# Patient Record
Sex: Male | Born: 1935 | Race: Black or African American | Hispanic: No | Smoking: Former smoker
Health system: Southern US, Community
[De-identification: ages and names within clinical notes are randomized; demographics above are authoritative.]

## PROBLEM LIST (undated history)

## (undated) ENCOUNTER — Inpatient Hospital Stay: Admission: EM | Payer: Self-pay | Source: Home / Self Care

## (undated) DIAGNOSIS — T7840XA Allergy, unspecified, initial encounter: Secondary | ICD-10-CM

## (undated) DIAGNOSIS — I10 Essential (primary) hypertension: Secondary | ICD-10-CM

## (undated) DIAGNOSIS — D126 Benign neoplasm of colon, unspecified: Secondary | ICD-10-CM

## (undated) DIAGNOSIS — N182 Chronic kidney disease, stage 2 (mild): Secondary | ICD-10-CM

## (undated) DIAGNOSIS — E1122 Type 2 diabetes mellitus with diabetic chronic kidney disease: Secondary | ICD-10-CM

## (undated) DIAGNOSIS — B019 Varicella without complication: Secondary | ICD-10-CM

## (undated) DIAGNOSIS — E785 Hyperlipidemia, unspecified: Secondary | ICD-10-CM

## (undated) DIAGNOSIS — E113419 Type 2 diabetes mellitus with severe nonproliferative diabetic retinopathy with macular edema, unspecified eye: Secondary | ICD-10-CM

## (undated) DIAGNOSIS — M1990 Unspecified osteoarthritis, unspecified site: Secondary | ICD-10-CM

## (undated) DIAGNOSIS — C61 Malignant neoplasm of prostate: Secondary | ICD-10-CM

## (undated) DIAGNOSIS — C189 Malignant neoplasm of colon, unspecified: Secondary | ICD-10-CM

## (undated) HISTORY — DX: Essential (primary) hypertension: I10

## (undated) HISTORY — DX: Chronic kidney disease, stage 2 (mild): N18.2

## (undated) HISTORY — DX: Type 2 diabetes mellitus with diabetic chronic kidney disease: E11.22

## (undated) HISTORY — DX: Type 2 diabetes mellitus with severe nonproliferative diabetic retinopathy with macular edema, unspecified eye: E11.3419

## (undated) HISTORY — DX: Benign neoplasm of colon, unspecified: D12.6

## (undated) HISTORY — DX: Malignant neoplasm of colon, unspecified: C18.9

## (undated) HISTORY — DX: Unspecified osteoarthritis, unspecified site: M19.90

## (undated) HISTORY — DX: Varicella without complication: B01.9

## (undated) HISTORY — DX: Allergy, unspecified, initial encounter: T78.40XA

## (undated) HISTORY — DX: Hyperlipidemia, unspecified: E78.5

---

## 2004-01-25 ENCOUNTER — Ambulatory Visit (HOSPITAL_COMMUNITY): Admission: RE | Admit: 2004-01-25 | Discharge: 2004-01-25 | Payer: Self-pay | Admitting: Urology

## 2005-09-02 ENCOUNTER — Emergency Department (HOSPITAL_COMMUNITY): Admission: EM | Admit: 2005-09-02 | Discharge: 2005-09-02 | Payer: Self-pay | Admitting: Family Medicine

## 2006-01-27 ENCOUNTER — Ambulatory Visit (HOSPITAL_COMMUNITY): Admission: RE | Admit: 2006-01-27 | Discharge: 2006-01-27 | Payer: Self-pay | Admitting: Urology

## 2006-08-04 ENCOUNTER — Ambulatory Visit (HOSPITAL_COMMUNITY): Admission: RE | Admit: 2006-08-04 | Discharge: 2006-08-04 | Payer: Self-pay | Admitting: Internal Medicine

## 2007-01-25 ENCOUNTER — Ambulatory Visit (HOSPITAL_COMMUNITY): Admission: RE | Admit: 2007-01-25 | Discharge: 2007-01-25 | Payer: Self-pay | Admitting: Urology

## 2007-07-30 ENCOUNTER — Encounter (HOSPITAL_COMMUNITY): Admission: RE | Admit: 2007-07-30 | Discharge: 2007-10-28 | Payer: Self-pay | Admitting: Urology

## 2009-07-18 ENCOUNTER — Inpatient Hospital Stay (HOSPITAL_COMMUNITY): Admission: EM | Admit: 2009-07-18 | Discharge: 2009-07-21 | Payer: Self-pay | Admitting: Emergency Medicine

## 2009-07-18 ENCOUNTER — Ambulatory Visit: Payer: Self-pay | Admitting: Cardiovascular Disease

## 2009-07-18 ENCOUNTER — Ambulatory Visit: Payer: Self-pay | Admitting: Oncology

## 2009-07-20 ENCOUNTER — Encounter (INDEPENDENT_AMBULATORY_CARE_PROVIDER_SITE_OTHER): Payer: Self-pay | Admitting: Internal Medicine

## 2009-08-08 ENCOUNTER — Ambulatory Visit: Payer: Self-pay | Admitting: Oncology

## 2009-08-14 ENCOUNTER — Inpatient Hospital Stay (HOSPITAL_COMMUNITY): Admission: RE | Admit: 2009-08-14 | Discharge: 2009-08-17 | Payer: Self-pay | Admitting: Surgery

## 2009-08-14 ENCOUNTER — Encounter (INDEPENDENT_AMBULATORY_CARE_PROVIDER_SITE_OTHER): Payer: Self-pay | Admitting: Surgery

## 2009-11-23 ENCOUNTER — Ambulatory Visit: Payer: Self-pay | Admitting: Oncology

## 2009-11-27 LAB — COMPREHENSIVE METABOLIC PANEL
ALT: 14 U/L (ref 0–53)
Alkaline Phosphatase: 46 U/L (ref 39–117)
Calcium: 9.4 mg/dL (ref 8.4–10.5)
Chloride: 104 mEq/L (ref 96–112)
Glucose, Bld: 195 mg/dL — ABNORMAL HIGH (ref 70–99)

## 2009-11-27 LAB — CBC WITH DIFFERENTIAL/PLATELET
Basophils Absolute: 0 10*3/uL (ref 0.0–0.1)
HGB: 11.1 g/dL — ABNORMAL LOW (ref 13.0–17.1)
LYMPH%: 23.7 % (ref 14.0–49.0)
MCV: 95.8 fL (ref 79.3–98.0)
MONO#: 0.4 10*3/uL (ref 0.1–0.9)
MONO%: 6.8 % (ref 0.0–14.0)
NEUT%: 68.3 % (ref 39.0–75.0)
Platelets: 279 10*3/uL (ref 140–400)
RDW: 14.2 % (ref 11.0–14.6)
WBC: 5.9 10*3/uL (ref 4.0–10.3)
lymph#: 1.4 10*3/uL (ref 0.9–3.3)

## 2010-02-22 ENCOUNTER — Ambulatory Visit: Payer: Self-pay | Admitting: Oncology

## 2010-02-26 ENCOUNTER — Ambulatory Visit (HOSPITAL_COMMUNITY): Admission: RE | Admit: 2010-02-26 | Discharge: 2010-02-26 | Payer: Self-pay | Admitting: Oncology

## 2010-03-19 ENCOUNTER — Ambulatory Visit (HOSPITAL_COMMUNITY): Admission: RE | Admit: 2010-03-19 | Discharge: 2010-03-19 | Payer: Self-pay | Admitting: Surgery

## 2010-05-14 ENCOUNTER — Inpatient Hospital Stay (HOSPITAL_COMMUNITY)
Admission: RE | Admit: 2010-05-14 | Discharge: 2010-05-21 | Payer: Self-pay | Source: Home / Self Care | Attending: Surgery | Admitting: Surgery

## 2010-05-14 ENCOUNTER — Encounter (INDEPENDENT_AMBULATORY_CARE_PROVIDER_SITE_OTHER): Payer: Self-pay | Admitting: Surgery

## 2010-06-07 ENCOUNTER — Other Ambulatory Visit: Payer: Self-pay | Admitting: Oncology

## 2010-06-07 DIAGNOSIS — Z85038 Personal history of other malignant neoplasm of large intestine: Secondary | ICD-10-CM

## 2010-07-29 LAB — GLUCOSE, CAPILLARY
Glucose-Capillary: 122 mg/dL — ABNORMAL HIGH (ref 70–99)
Glucose-Capillary: 124 mg/dL — ABNORMAL HIGH (ref 70–99)
Glucose-Capillary: 129 mg/dL — ABNORMAL HIGH (ref 70–99)
Glucose-Capillary: 148 mg/dL — ABNORMAL HIGH (ref 70–99)
Glucose-Capillary: 153 mg/dL — ABNORMAL HIGH (ref 70–99)
Glucose-Capillary: 154 mg/dL — ABNORMAL HIGH (ref 70–99)
Glucose-Capillary: 172 mg/dL — ABNORMAL HIGH (ref 70–99)
Glucose-Capillary: 178 mg/dL — ABNORMAL HIGH (ref 70–99)
Glucose-Capillary: 178 mg/dL — ABNORMAL HIGH (ref 70–99)
Glucose-Capillary: 180 mg/dL — ABNORMAL HIGH (ref 70–99)
Glucose-Capillary: 185 mg/dL — ABNORMAL HIGH (ref 70–99)
Glucose-Capillary: 186 mg/dL — ABNORMAL HIGH (ref 70–99)
Glucose-Capillary: 219 mg/dL — ABNORMAL HIGH (ref 70–99)
Glucose-Capillary: 263 mg/dL — ABNORMAL HIGH (ref 70–99)
Glucose-Capillary: 47 mg/dL — ABNORMAL LOW (ref 70–99)
Glucose-Capillary: 53 mg/dL — ABNORMAL LOW (ref 70–99)
Glucose-Capillary: 55 mg/dL — ABNORMAL LOW (ref 70–99)
Glucose-Capillary: 55 mg/dL — ABNORMAL LOW (ref 70–99)
Glucose-Capillary: 69 mg/dL — ABNORMAL LOW (ref 70–99)
Glucose-Capillary: 70 mg/dL (ref 70–99)
Glucose-Capillary: 70 mg/dL (ref 70–99)
Glucose-Capillary: 72 mg/dL (ref 70–99)
Glucose-Capillary: 88 mg/dL (ref 70–99)
Glucose-Capillary: 97 mg/dL (ref 70–99)

## 2010-07-29 LAB — CBC
HCT: 25.6 % — ABNORMAL LOW (ref 39.0–52.0)
HCT: 32.5 % — ABNORMAL LOW (ref 39.0–52.0)
Hemoglobin: 9.3 g/dL — ABNORMAL LOW (ref 13.0–17.0)
MCH: 30.7 pg (ref 26.0–34.0)
MCH: 31.1 pg (ref 26.0–34.0)
MCHC: 33.1 g/dL (ref 30.0–36.0)
MCHC: 33.5 g/dL (ref 30.0–36.0)
MCV: 93.3 fL (ref 78.0–100.0)
Platelets: 264 10*3/uL (ref 150–400)
Platelets: 272 10*3/uL (ref 150–400)
RBC: 2.75 MIL/uL — ABNORMAL LOW (ref 4.22–5.81)
RBC: 2.96 MIL/uL — ABNORMAL LOW (ref 4.22–5.81)
RBC: 3.46 MIL/uL — ABNORMAL LOW (ref 4.22–5.81)
RDW: 12.7 % (ref 11.5–15.5)
RDW: 12.7 % (ref 11.5–15.5)
WBC: 14.5 10*3/uL — ABNORMAL HIGH (ref 4.0–10.5)
WBC: 19.3 10*3/uL — ABNORMAL HIGH (ref 4.0–10.5)
WBC: 7 10*3/uL (ref 4.0–10.5)

## 2010-07-29 LAB — COMPREHENSIVE METABOLIC PANEL
AST: 15 U/L (ref 0–37)
Albumin: 3.8 g/dL (ref 3.5–5.2)
BUN: 21 mg/dL (ref 6–23)
Chloride: 108 mEq/L (ref 96–112)
GFR calc Af Amer: 51 mL/min — ABNORMAL LOW (ref >60)
Sodium: 137 mEq/L (ref 135–145)
Total Protein: 7.4 g/dL (ref 6.0–8.3)

## 2010-07-29 LAB — BASIC METABOLIC PANEL
BUN: 11 mg/dL (ref 6–23)
BUN: 14 mg/dL (ref 6–23)
CO2: 26 mEq/L (ref 19–32)
CO2: 28 mEq/L (ref 19–32)
Calcium: 8.5 mg/dL (ref 8.4–10.5)
Chloride: 100 mEq/L (ref 96–112)
Creatinine, Ser: 1.42 mg/dL (ref 0.4–1.5)
GFR calc Af Amer: 59 mL/min — ABNORMAL LOW (ref >60)
GFR calc Af Amer: >60 mL/min (ref >60)
GFR calc non Af Amer: 49 mL/min — ABNORMAL LOW (ref >60)
GFR calc non Af Amer: 54 mL/min — ABNORMAL LOW (ref >60)
Potassium: 3.7 mEq/L (ref 3.5–5.1)
Sodium: 135 mEq/L (ref 135–145)
Sodium: 136 mEq/L (ref 135–145)

## 2010-07-29 LAB — HEMOGLOBIN A1C: Mean Plasma Glucose: 131 mg/dL — ABNORMAL HIGH (ref <117)

## 2010-08-07 LAB — CBC
MCHC: 31.8 g/dL (ref 30.0–36.0)
Platelets: 252 10*3/uL (ref 150–400)
RDW: 29.1 % — ABNORMAL HIGH (ref 11.5–15.5)

## 2010-08-07 LAB — CREATININE, SERUM
Creatinine, Ser: 1.33 mg/dL (ref 0.4–1.5)
GFR calc Af Amer: >60 mL/min (ref >60)
GFR calc non Af Amer: 52 mL/min — ABNORMAL LOW (ref >60)

## 2010-08-12 LAB — RETICULOCYTES
RBC.: 3.06 MIL/uL — ABNORMAL LOW (ref 4.22–5.81)
Retic Ct Pct: 1.2 % (ref 0.4–3.1)
Retic Ct Pct: 1.4 % (ref 0.4–3.1)

## 2010-08-12 LAB — CBC
HCT: 21.2 % — ABNORMAL LOW (ref 39.0–52.0)
HCT: 26.6 % — ABNORMAL LOW (ref 39.0–52.0)
HCT: 28.6 % — ABNORMAL LOW (ref 39.0–52.0)
HCT: 30.3 % — ABNORMAL LOW (ref 39.0–52.0)
Hemoglobin: 12 g/dL — ABNORMAL LOW (ref 13.0–17.0)
Hemoglobin: 9.4 g/dL — ABNORMAL LOW (ref 13.0–17.0)
MCHC: 30.4 g/dL (ref 30.0–36.0)
MCHC: 31.9 g/dL (ref 30.0–36.0)
MCHC: 32.1 g/dL (ref 30.0–36.0)
MCHC: 32.1 g/dL (ref 30.0–36.0)
MCHC: 32.2 g/dL (ref 30.0–36.0)
MCV: 68.3 fL — ABNORMAL LOW (ref 78.0–100.0)
MCV: 76.3 fL — ABNORMAL LOW (ref 78.0–100.0)
MCV: 78.2 fL (ref 78.0–100.0)
MCV: 81.5 fL (ref 78.0–100.0)
MCV: 83.2 fL (ref 78.0–100.0)
MCV: 83.6 fL (ref 78.0–100.0)
Platelets: 281 10*3/uL (ref 150–400)
Platelets: 281 10*3/uL (ref 150–400)
Platelets: 369 10*3/uL (ref 150–400)
Platelets: 374 10*3/uL (ref 150–400)
Platelets: 474 10*3/uL — ABNORMAL HIGH (ref 150–400)
RBC: 3.11 MIL/uL — ABNORMAL LOW (ref 4.22–5.81)
RBC: 3.48 MIL/uL — ABNORMAL LOW (ref 4.22–5.81)
RBC: 3.88 MIL/uL — ABNORMAL LOW (ref 4.22–5.81)
RBC: 4.58 MIL/uL (ref 4.22–5.81)
RDW: 24 % — ABNORMAL HIGH (ref 11.5–15.5)
RDW: 28.9 % — ABNORMAL HIGH (ref 11.5–15.5)
WBC: 13.5 10*3/uL — ABNORMAL HIGH (ref 4.0–10.5)
WBC: 18.7 10*3/uL — ABNORMAL HIGH (ref 4.0–10.5)
WBC: 8.1 10*3/uL (ref 4.0–10.5)
WBC: 8.4 10*3/uL (ref 4.0–10.5)
WBC: 9 10*3/uL (ref 4.0–10.5)

## 2010-08-12 LAB — CROSSMATCH

## 2010-08-12 LAB — BASIC METABOLIC PANEL
BUN: 14 mg/dL (ref 6–23)
BUN: 6 mg/dL (ref 6–23)
CO2: 23 mEq/L (ref 19–32)
CO2: 24 mEq/L (ref 19–32)
Chloride: 102 mEq/L (ref 96–112)
Chloride: 107 mEq/L (ref 96–112)
Chloride: 110 mEq/L (ref 96–112)
Creatinine, Ser: 1.56 mg/dL — ABNORMAL HIGH (ref 0.4–1.5)
GFR calc Af Amer: 52 mL/min — ABNORMAL LOW (ref >60)
GFR calc non Af Amer: 54 mL/min — ABNORMAL LOW (ref >60)
Glucose, Bld: 194 mg/dL — ABNORMAL HIGH (ref 70–99)
Potassium: 3.7 mEq/L (ref 3.5–5.1)
Potassium: 3.9 mEq/L (ref 3.5–5.1)
Sodium: 134 mEq/L — ABNORMAL LOW (ref 135–145)
Sodium: 139 mEq/L (ref 135–145)

## 2010-08-12 LAB — GLUCOSE, CAPILLARY
Glucose-Capillary: 133 mg/dL — ABNORMAL HIGH (ref 70–99)
Glucose-Capillary: 174 mg/dL — ABNORMAL HIGH (ref 70–99)
Glucose-Capillary: 181 mg/dL — ABNORMAL HIGH (ref 70–99)
Glucose-Capillary: 67 mg/dL — ABNORMAL LOW (ref 70–99)
Glucose-Capillary: 83 mg/dL (ref 70–99)
Glucose-Capillary: 97 mg/dL (ref 70–99)

## 2010-08-12 LAB — TYPE AND SCREEN: ABO/RH(D): O POS

## 2010-08-12 LAB — DIFFERENTIAL
Basophils Absolute: 0 10*3/uL (ref 0.0–0.1)
Eosinophils Absolute: 0 10*3/uL (ref 0.0–0.7)
Monocytes Absolute: 0.8 10*3/uL (ref 0.1–1.0)
Neutrophils Relative %: 80 % — ABNORMAL HIGH (ref 43–77)

## 2010-08-12 LAB — COMPREHENSIVE METABOLIC PANEL
BUN: 13 mg/dL (ref 6–23)
CO2: 25 mEq/L (ref 19–32)
Calcium: 8.3 mg/dL — ABNORMAL LOW (ref 8.4–10.5)
Chloride: 105 mEq/L (ref 96–112)
Creatinine, Ser: 1.78 mg/dL — ABNORMAL HIGH (ref 0.4–1.5)
GFR calc non Af Amer: 37 mL/min — ABNORMAL LOW (ref >60)
Total Bilirubin: 0.6 mg/dL (ref 0.3–1.2)

## 2010-08-12 LAB — URINALYSIS, ROUTINE W REFLEX MICROSCOPIC
Bilirubin Urine: NEGATIVE
Hgb urine dipstick: NEGATIVE
Specific Gravity, Urine: 1.023 (ref 1.005–1.030)
pH: 5.5 (ref 5.0–8.0)

## 2010-08-12 LAB — PROTIME-INR
INR: 0.97 (ref 0.00–1.49)
Prothrombin Time: 12.8 seconds (ref 11.6–15.2)
Prothrombin Time: 13.2 seconds (ref 11.6–15.2)

## 2010-08-12 LAB — IGG, IGA, IGM
IgA: 209 mg/dL (ref 68–378)
IgM, Serum: 109 mg/dL (ref 60–263)

## 2010-08-12 LAB — ABO/RH: ABO/RH(D): O POS

## 2010-08-12 LAB — FERRITIN: Ferritin: 8 ng/mL — ABNORMAL LOW (ref 22–322)

## 2010-08-12 LAB — PROTEIN ELECTROPH W RFLX QUANT IMMUNOGLOBULINS
Beta Globulin: 7.6 % — ABNORMAL HIGH (ref 4.7–7.2)
Gamma Globulin: 18.9 % — ABNORMAL HIGH (ref 11.1–18.8)
M-Spike, %: 0.6 g/dL
Total Protein ELP: 7.1 g/dL (ref 6.0–8.3)

## 2010-08-12 LAB — URINE MICROSCOPIC-ADD ON

## 2010-08-12 LAB — CREATININE, SERUM
Creatinine, Ser: 1.71 mg/dL — ABNORMAL HIGH (ref 0.4–1.5)
GFR calc Af Amer: 47 mL/min — ABNORMAL LOW (ref >60)
GFR calc Af Amer: 55 mL/min — ABNORMAL LOW (ref >60)

## 2010-08-12 LAB — IRON AND TIBC

## 2010-08-12 LAB — POTASSIUM: Potassium: 4.5 mEq/L (ref 3.5–5.1)

## 2010-08-12 LAB — FOLATE: Folate: 11.4 ng/mL

## 2010-08-12 LAB — POCT CARDIAC MARKERS

## 2010-08-27 ENCOUNTER — Other Ambulatory Visit: Payer: Self-pay | Admitting: Oncology

## 2010-08-27 ENCOUNTER — Ambulatory Visit (HOSPITAL_COMMUNITY)
Admission: RE | Admit: 2010-08-27 | Discharge: 2010-08-27 | Disposition: A | Discharge status: Home / Self Care | Payer: Medicare Other | Source: Ambulatory Visit | Attending: Oncology | Admitting: Oncology

## 2010-08-27 ENCOUNTER — Encounter (HOSPITAL_BASED_OUTPATIENT_CLINIC_OR_DEPARTMENT_OTHER): Payer: Medicare Other | Admitting: Oncology

## 2010-08-27 ENCOUNTER — Encounter (HOSPITAL_COMMUNITY): Payer: Self-pay

## 2010-08-27 DIAGNOSIS — Z85038 Personal history of other malignant neoplasm of large intestine: Secondary | ICD-10-CM | POA: Insufficient documentation

## 2010-08-27 DIAGNOSIS — Z8546 Personal history of malignant neoplasm of prostate: Secondary | ICD-10-CM | POA: Insufficient documentation

## 2010-08-27 DIAGNOSIS — C184 Malignant neoplasm of transverse colon: Secondary | ICD-10-CM

## 2010-08-27 DIAGNOSIS — Z09 Encounter for follow-up examination after completed treatment for conditions other than malignant neoplasm: Secondary | ICD-10-CM | POA: Insufficient documentation

## 2010-08-27 DIAGNOSIS — I700 Atherosclerosis of aorta: Secondary | ICD-10-CM | POA: Insufficient documentation

## 2010-08-27 DIAGNOSIS — K5730 Diverticulosis of large intestine without perforation or abscess without bleeding: Secondary | ICD-10-CM | POA: Insufficient documentation

## 2010-08-27 DIAGNOSIS — Q618 Other cystic kidney diseases: Secondary | ICD-10-CM | POA: Insufficient documentation

## 2010-08-27 HISTORY — DX: Malignant neoplasm of prostate: C61

## 2010-08-27 HISTORY — DX: Essential (primary) hypertension: I10

## 2010-08-27 HISTORY — DX: Malignant neoplasm of colon, unspecified: C18.9

## 2010-08-27 LAB — CMP (CANCER CENTER ONLY)
ALT(SGPT): 13 U/L (ref 10–47)
AST: 19 U/L (ref 11–38)
Albumin: 3.6 g/dL (ref 3.3–5.5)
Alkaline Phosphatase: 56 U/L (ref 26–84)
BUN, Bld: 16 mg/dL (ref 7–22)
CO2: 24 mEq/L (ref 18–33)
Calcium: 9.2 mg/dL (ref 8.0–10.3)
Chloride: 103 mEq/L (ref 98–108)
Creat: 1.5 mg/dl — ABNORMAL HIGH (ref 0.6–1.2)
Glucose, Bld: 129 mg/dL — ABNORMAL HIGH (ref 73–118)
Potassium: 3.8 mEq/L (ref 3.3–4.7)
Sodium: 139 mEq/L (ref 128–145)
Total Bilirubin: 0.7 mg/dl (ref 0.20–1.60)
Total Protein: 7.8 g/dL (ref 6.4–8.1)

## 2010-08-27 LAB — CBC WITH DIFFERENTIAL/PLATELET
BASO%: 0.5 % (ref 0.0–2.0)
Basophils Absolute: 0 10*3/uL (ref 0.0–0.1)
EOS%: 0.5 % (ref 0.0–7.0)
Eosinophils Absolute: 0 10*3/uL (ref 0.0–0.5)
HCT: 32 % — ABNORMAL LOW (ref 38.4–49.9)
HGB: 10.8 g/dL — ABNORMAL LOW (ref 13.0–17.1)
LYMPH%: 23 % (ref 14.0–49.0)
MCH: 31.6 pg (ref 27.2–33.4)
MCHC: 33.9 g/dL (ref 32.0–36.0)
MCV: 93.2 fL (ref 79.3–98.0)
MONO#: 0.5 10*3/uL (ref 0.1–0.9)
MONO%: 6.4 % (ref 0.0–14.0)
NEUT#: 5.2 10*3/uL (ref 1.5–6.5)
NEUT%: 69.6 % (ref 39.0–75.0)
Platelets: 291 10*3/uL (ref 140–400)
RBC: 3.44 10*6/uL — ABNORMAL LOW (ref 4.20–5.82)
RDW: 14.7 % — ABNORMAL HIGH (ref 11.0–14.6)
WBC: 7.5 10*3/uL (ref 4.0–10.3)
lymph#: 1.7 10*3/uL (ref 0.9–3.3)

## 2010-08-27 LAB — LACTATE DEHYDROGENASE: LDH: 106 U/L (ref 94–250)

## 2010-08-27 MED ORDER — IOHEXOL 300 MG/ML  SOLN
80.0000 mL | Freq: Once | INTRAMUSCULAR | Status: AC | PRN
  Administered 2010-08-27: 80 mL via INTRAVENOUS

## 2010-09-03 ENCOUNTER — Other Ambulatory Visit: Payer: Self-pay | Admitting: Oncology

## 2010-09-03 ENCOUNTER — Encounter (HOSPITAL_BASED_OUTPATIENT_CLINIC_OR_DEPARTMENT_OTHER): Payer: Medicare Other | Admitting: Oncology

## 2010-09-03 DIAGNOSIS — Z85038 Personal history of other malignant neoplasm of large intestine: Secondary | ICD-10-CM

## 2010-09-03 DIAGNOSIS — D649 Anemia, unspecified: Secondary | ICD-10-CM

## 2010-09-03 DIAGNOSIS — C184 Malignant neoplasm of transverse colon: Secondary | ICD-10-CM

## 2010-09-03 DIAGNOSIS — E119 Type 2 diabetes mellitus without complications: Secondary | ICD-10-CM

## 2011-03-04 ENCOUNTER — Encounter (HOSPITAL_BASED_OUTPATIENT_CLINIC_OR_DEPARTMENT_OTHER): Payer: Medicare Other | Admitting: Oncology

## 2011-03-04 ENCOUNTER — Other Ambulatory Visit: Payer: Self-pay | Admitting: Oncology

## 2011-03-04 ENCOUNTER — Ambulatory Visit (HOSPITAL_COMMUNITY)
Admission: RE | Admit: 2011-03-04 | Discharge: 2011-03-04 | Disposition: A | Discharge status: Home / Self Care | Payer: Medicare Other | Source: Ambulatory Visit | Attending: Oncology | Admitting: Oncology

## 2011-03-04 DIAGNOSIS — Z923 Personal history of irradiation: Secondary | ICD-10-CM | POA: Insufficient documentation

## 2011-03-04 DIAGNOSIS — C189 Malignant neoplasm of colon, unspecified: Secondary | ICD-10-CM | POA: Insufficient documentation

## 2011-03-04 DIAGNOSIS — D175 Benign lipomatous neoplasm of intra-abdominal organs: Secondary | ICD-10-CM | POA: Insufficient documentation

## 2011-03-04 DIAGNOSIS — Z85038 Personal history of other malignant neoplasm of large intestine: Secondary | ICD-10-CM

## 2011-03-04 DIAGNOSIS — C184 Malignant neoplasm of transverse colon: Secondary | ICD-10-CM

## 2011-03-04 DIAGNOSIS — Z8546 Personal history of malignant neoplasm of prostate: Secondary | ICD-10-CM | POA: Insufficient documentation

## 2011-03-04 DIAGNOSIS — Z980 Intestinal bypass and anastomosis status: Secondary | ICD-10-CM | POA: Insufficient documentation

## 2011-03-04 LAB — CBC WITH DIFFERENTIAL/PLATELET
Basophils Absolute: 0 10*3/uL (ref 0.0–0.1)
Eosinophils Absolute: 0.1 10*3/uL (ref 0.0–0.5)
HGB: 11.5 g/dL — ABNORMAL LOW (ref 13.0–17.1)
LYMPH%: 21.5 % (ref 14.0–49.0)
MONO#: 0.5 10*3/uL (ref 0.1–0.9)
NEUT#: 4.6 10*3/uL (ref 1.5–6.5)
Platelets: 283 10*3/uL (ref 140–400)
RBC: 3.59 10*6/uL — ABNORMAL LOW (ref 4.20–5.82)
WBC: 6.6 10*3/uL (ref 4.0–10.3)

## 2011-03-04 LAB — CEA: CEA: 0.7 ng/mL (ref 0.0–5.0)

## 2011-03-04 LAB — LACTATE DEHYDROGENASE: LDH: 114 U/L (ref 94–250)

## 2011-03-04 LAB — CMP (CANCER CENTER ONLY)
Albumin: 3.5 g/dL (ref 3.3–5.5)
BUN, Bld: 16 mg/dL (ref 7–22)
CO2: 27 mEq/L (ref 18–33)
Glucose, Bld: 129 mg/dL — ABNORMAL HIGH (ref 73–118)
Potassium: 4.6 mEq/L (ref 3.3–4.7)
Sodium: 141 mEq/L (ref 128–145)
Total Bilirubin: 0.7 mg/dl (ref 0.20–1.60)
Total Protein: 7.7 g/dL (ref 6.4–8.1)

## 2011-03-11 ENCOUNTER — Encounter (HOSPITAL_BASED_OUTPATIENT_CLINIC_OR_DEPARTMENT_OTHER): Payer: Medicare Other | Admitting: Oncology

## 2011-03-11 DIAGNOSIS — E119 Type 2 diabetes mellitus without complications: Secondary | ICD-10-CM

## 2011-03-11 DIAGNOSIS — C184 Malignant neoplasm of transverse colon: Secondary | ICD-10-CM

## 2011-03-11 DIAGNOSIS — I10 Essential (primary) hypertension: Secondary | ICD-10-CM

## 2011-08-17 IMAGING — CT CT ABD-PELV W/ CM
2 of 5 series · 16 of 46 positions shown, 18 images · IV contrast (APPLIED)
Comparison: None.

CLINICAL DATA: Abnormal colonoscopy - evaluate for metastatic
disease.  History of prostate cancer and anemia.

CT ABDOMEN AND PELVIS WITH CONTRAST
TECHNIQUE: Multidetector CT imaging of the abdomen and pelvis was
performed following the standard protocol during bolus
administration of intravenous contrast.
Contrast: 125 ml Gmnipaque-XKK intravenously.  Oral contrast was
given.

[Series 2: abd_pel 5.0 b40f st · axial · 0.76mm/px · z∈[-483,-83]mm · 13 of 90 slices shown, 15 images]
[im 5/90  soft-tissue]
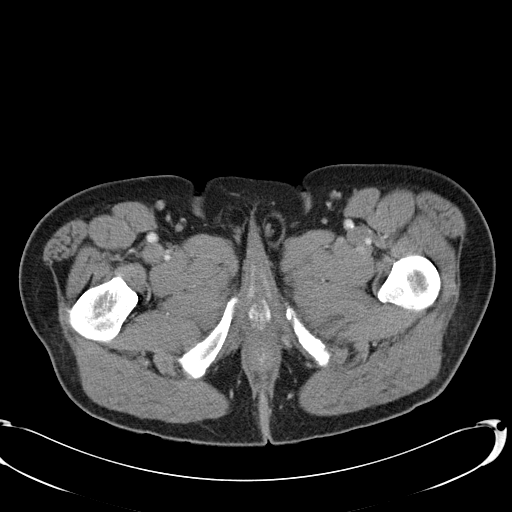
[im 5/90  bone]
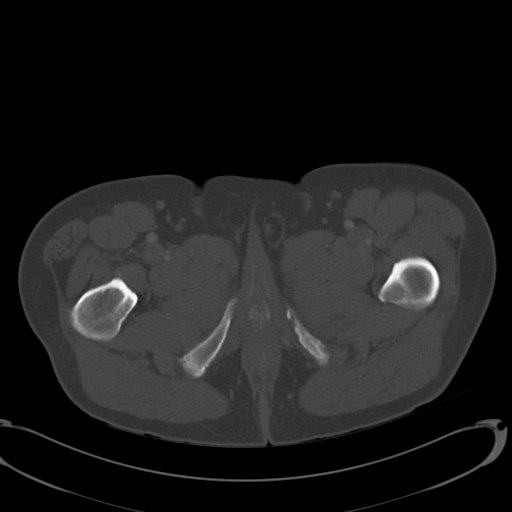
[im 15/90  soft-tissue]
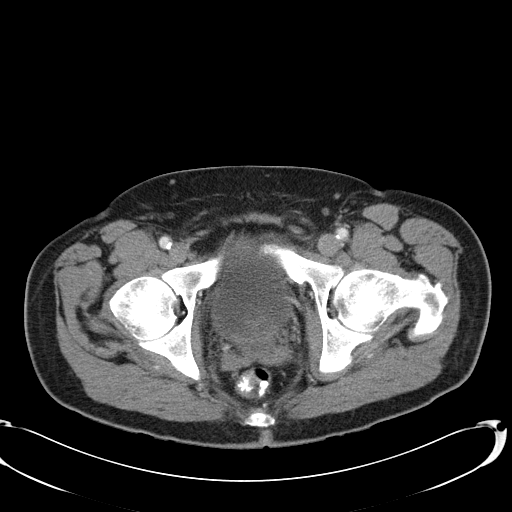
[im 19/90  soft-tissue]
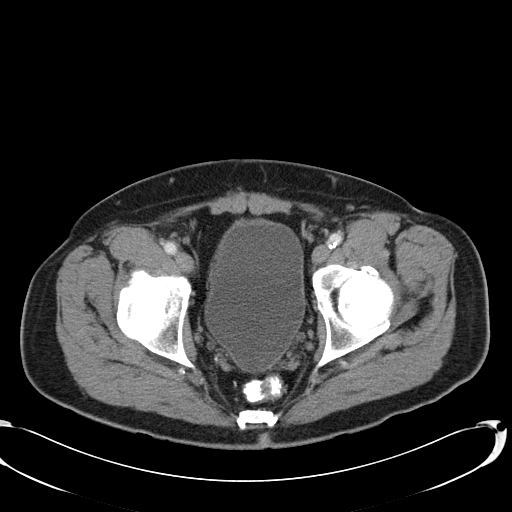
[im 24/90  soft-tissue]
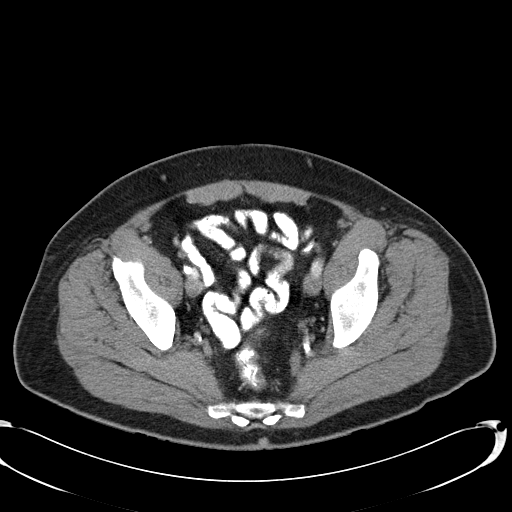
[im 33/90  soft-tissue]
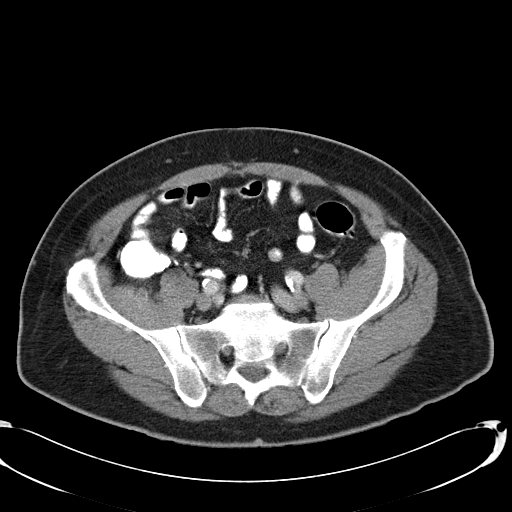
[im 38/90  soft-tissue]
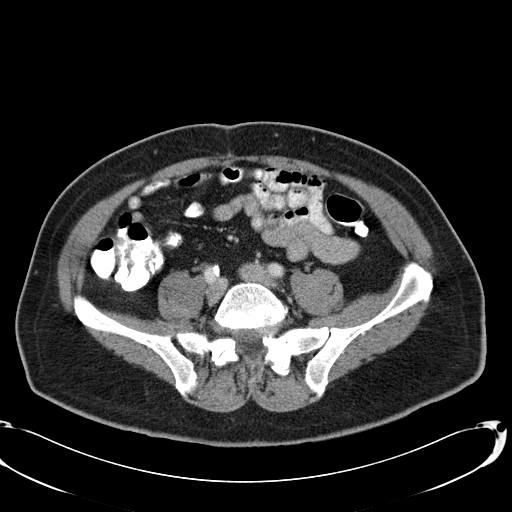
[im 47/90  soft-tissue]
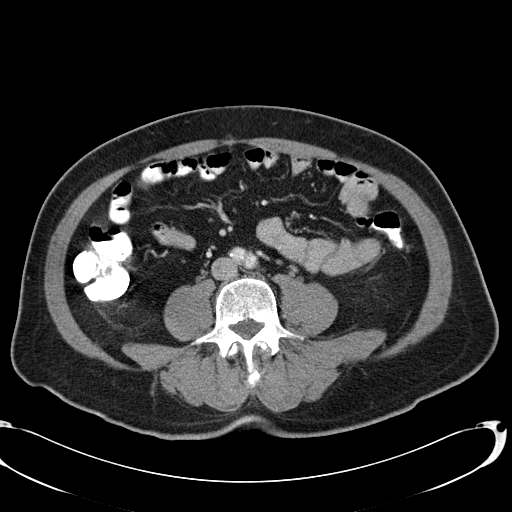
[im 52/90  soft-tissue]
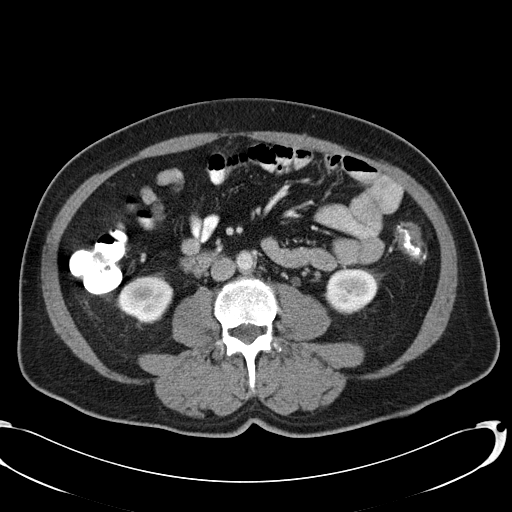
[im 57/90  soft-tissue]
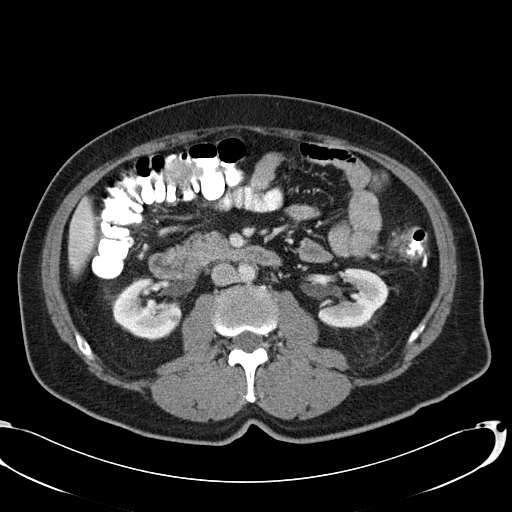
[im 57/90  bone]
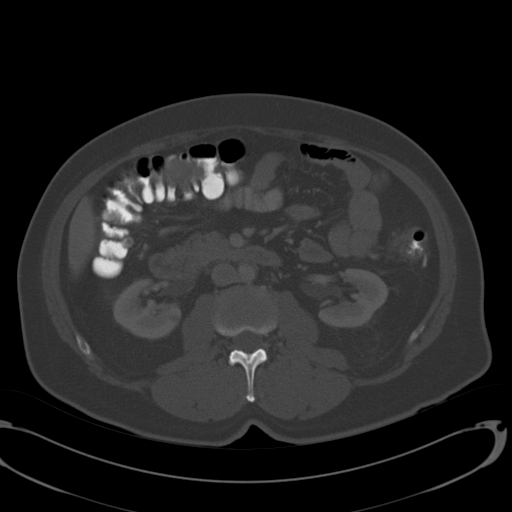
[im 66/90  soft-tissue]
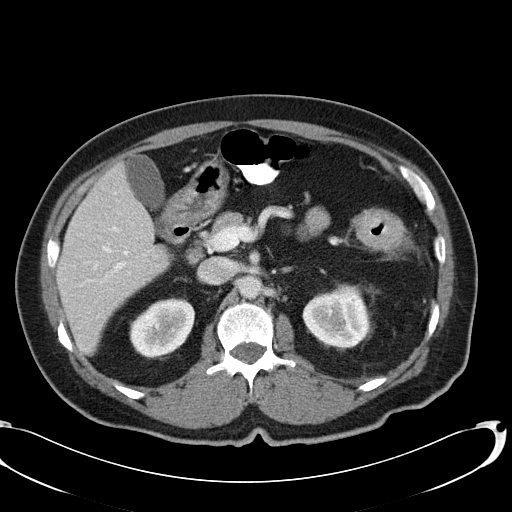
[im 71/90  soft-tissue]
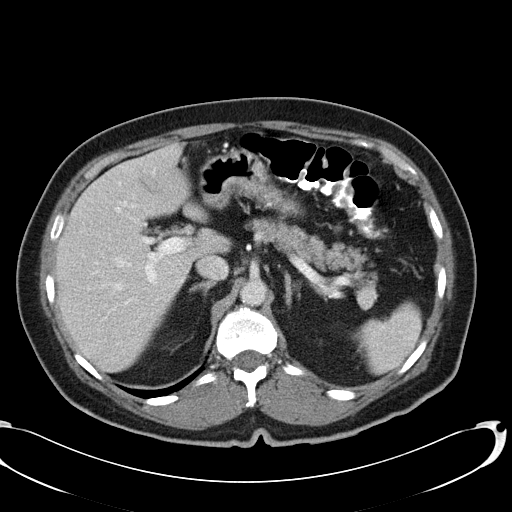
[im 75/90  soft-tissue]
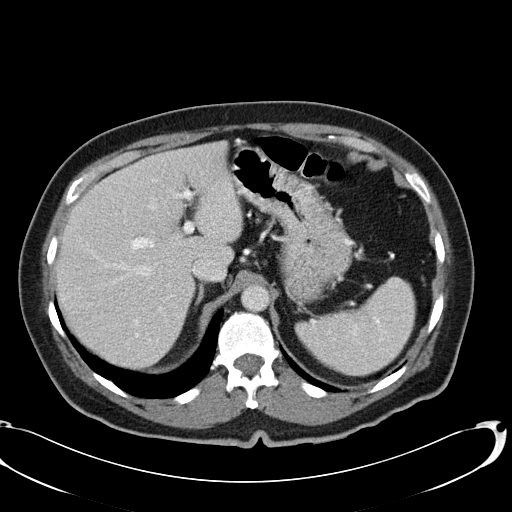
[im 85/90  soft-tissue]
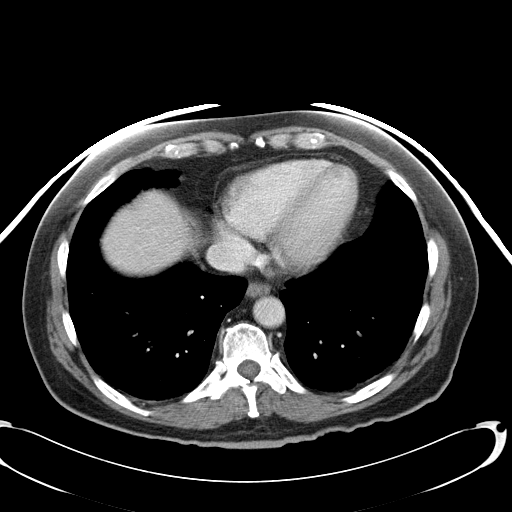

[Series 602: coronal abdomen · coronal · 0.91mm/px · 3 of 129 slices shown]
[im 43/129  soft-tissue]
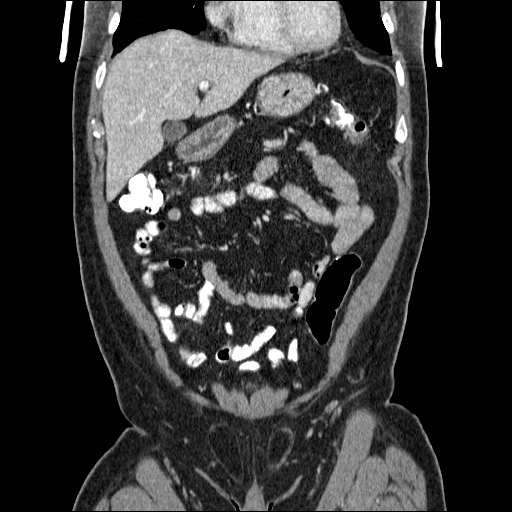
[im 57/129  soft-tissue]
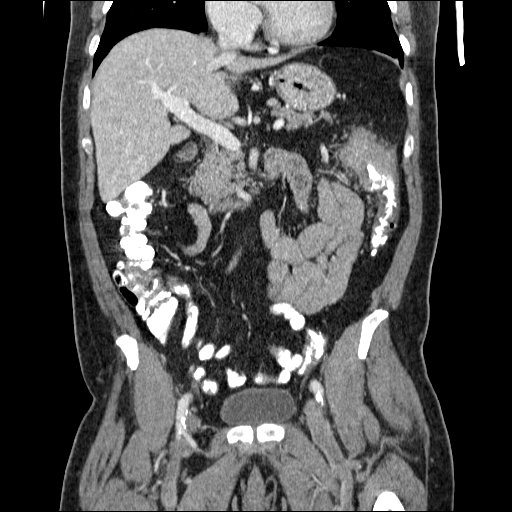
[im 72/129  soft-tissue]
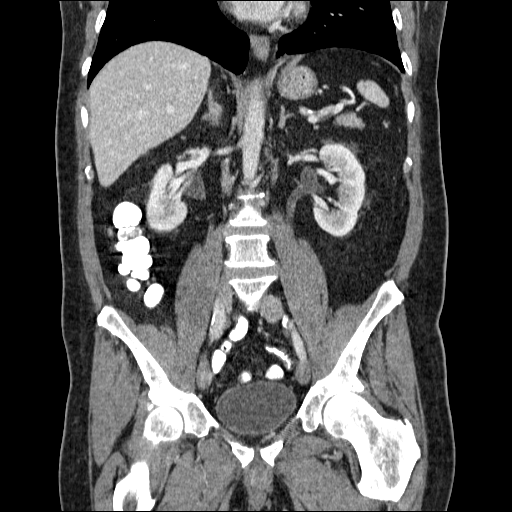

[16 of 46 positions shown; findings below may reference images not displayed]

FINDINGS: There is a small amount residual stool in the colon.  The
right colon is incompletely distended.  There is an apparent
irregular intraluminal mass proximally in the transverse colon,
measuring 4.5 x 3.0 cm on image 32.  More distally, there is marked
circumferential thickening of the walls of the colon at the splenic
flexure extending over approximately 8 cm.  There is mild stranding
in the adjacent pericolonic fat, but no focal extraluminal fluid or
air collection is identified.  There are mild diverticula of the
distal colon.  There is no evidence of bowel obstruction.

The lung bases are clear.  There is no pleural effusion.  The
liver, spleen, gallbladder, pancreas, adrenal glands and kidneys
appear normal aside from a probable sub centimeter cyst posteriorly
in the right kidney.

There is a 12 mm nonspecific portacaval node on image 24.  There
are no enlarged mesenteric lymph nodes.  There is no ascites or
peritoneal nodularity.  There are no suspicious osseous findings.
IMPRESSION: 1.  Apparent large intraluminal mass in the proximal transverse
colon, suspicious for colon cancer or a large villous adenoma.
Correlation with prior colonoscopy is recommended.
2.  Moderate segment area of marked circumferential bowel wall
thickening near the splenic flexure of the colon suspicious for
infiltrating neoplasm.  Focal colitis/diverticulitis is considered
less likely.  Correlation with prior colonoscopy is recommended.
3.  No definite evidence of metastatic disease.

## 2011-09-02 ENCOUNTER — Other Ambulatory Visit (HOSPITAL_BASED_OUTPATIENT_CLINIC_OR_DEPARTMENT_OTHER): Payer: Medicare Other | Admitting: Lab

## 2011-09-02 DIAGNOSIS — D649 Anemia, unspecified: Secondary | ICD-10-CM

## 2011-09-02 DIAGNOSIS — C184 Malignant neoplasm of transverse colon: Secondary | ICD-10-CM

## 2011-09-02 DIAGNOSIS — E119 Type 2 diabetes mellitus without complications: Secondary | ICD-10-CM

## 2011-09-02 LAB — CBC WITH DIFFERENTIAL/PLATELET
BASO%: 0.6 % (ref 0.0–2.0)
EOS%: 0.9 % (ref 0.0–7.0)
MCH: 31.7 pg (ref 27.2–33.4)
MCHC: 33.1 g/dL (ref 32.0–36.0)
RDW: 14.3 % (ref 11.0–14.6)
lymph#: 1.6 10*3/uL (ref 0.9–3.3)
nRBC: 0 % (ref 0–0)

## 2011-09-04 LAB — COMPREHENSIVE METABOLIC PANEL
AST: 13 U/L (ref 0–37)
Albumin: 3.8 g/dL (ref 3.5–5.2)
Alkaline Phosphatase: 55 U/L (ref 39–117)
Potassium: 3.8 mEq/L (ref 3.5–5.3)
Sodium: 137 mEq/L (ref 135–145)
Total Protein: 6.7 g/dL (ref 6.0–8.3)

## 2011-09-04 LAB — IMMUNOFIXATION ELECTROPHORESIS
IgM, Serum: 123 mg/dL (ref 41–251)
Total Protein, Serum Electrophoresis: 6.7 g/dL (ref 6.0–8.3)

## 2011-09-09 ENCOUNTER — Ambulatory Visit (HOSPITAL_BASED_OUTPATIENT_CLINIC_OR_DEPARTMENT_OTHER): Payer: Medicare Other | Admitting: Nurse Practitioner

## 2011-09-09 ENCOUNTER — Telehealth: Payer: Self-pay | Admitting: Oncology

## 2011-09-09 VITALS — BP 121/64 | HR 67 | Temp 97.5°F | Wt 180.5 lb

## 2011-09-09 DIAGNOSIS — D126 Benign neoplasm of colon, unspecified: Secondary | ICD-10-CM

## 2011-09-09 DIAGNOSIS — E119 Type 2 diabetes mellitus without complications: Secondary | ICD-10-CM

## 2011-09-09 DIAGNOSIS — C184 Malignant neoplasm of transverse colon: Secondary | ICD-10-CM

## 2011-09-09 DIAGNOSIS — Z85038 Personal history of other malignant neoplasm of large intestine: Secondary | ICD-10-CM

## 2011-09-09 DIAGNOSIS — I10 Essential (primary) hypertension: Secondary | ICD-10-CM

## 2011-09-09 NOTE — Telephone Encounter (Signed)
appts made and printed for pt,pt stated that he has contrast at home   aom

## 2011-09-09 NOTE — Progress Notes (Signed)
OFFICE PROGRESS NOTE  Interval history:  Mr. Winthrop is a 78 year old man who initially presented with anemia in March 2011. He was found to have a large lesion in the transverse colon. He underwent initial surgical resection on 08/14/2009. He was found to have a 6.5 cm, grade 2, adenocarcinoma with 25 negative lymph nodes. Tubular adenomas and a sessile tubulovillous adenoma were also contained within the surgical specimen. No vascular or lymphatic invasion identified. Followup CT scan 02/26/2010 showed a focal area of soft tissue density in the area of the hepatic flexure. Repeat colonoscopy on 04/01/2010 showed a number of small sessile polyps in the sigmoid, descending and proximal transverse colon removed by hot snare. In addition there was one large polyp at the hepatic flexure that can only be partially resected. On 05/14/2010 he underwent a right hemicolectomy. Pathology showed 2 tubular adenomas, 3 cm and 1 cm, with high-grade dysplasia but no invasion. 14 additional lymph nodes taken at the time of that surgery were negative.  Mr. Osborn reports that he feels well. No change in bowel habits. No abdominal pain. He denies nausea/vomiting. He has a good appetite and good energy level.   Objective: Blood pressure 121/64, pulse 67, temperature 97.5 F (36.4 C), temperature source Oral, weight 180 lb 8 oz (81.874 kg).  Oropharynx is without thrush or ulceration. No palpable cervical, supraclavicular, axillary or inguinal lymph nodes. Lungs are clear. Regular cardiac rhythm. Abdomen is soft and nontender. No mass. Organomegaly. Extremities are without edema.  Lab Results: Lab Results  Component Value Date   WBC 7.5 09/02/2011   HGB 12.0* 09/02/2011   HCT 36.2* 09/02/2011   MCV 95.8 09/02/2011   PLT 268 09/02/2011    Chemistry:    Chemistry      Component Value Date/Time   NA 137 09/02/2011 0858   NA 141 03/04/2011 0858   K 3.8 09/02/2011 0858   K 4.6 03/04/2011 0858   CL 103 09/02/2011 0858     CL 98 03/04/2011 0858   CO2 28 09/02/2011 0858   CO2 27 03/04/2011 0858   BUN 13 09/02/2011 0858   BUN 16 03/04/2011 0858   CREATININE 1.51* 09/02/2011 0858   CREATININE 1.8* 03/04/2011 0858      Component Value Date/Time   CALCIUM 9.2 09/02/2011 0858   CALCIUM 9.2 03/04/2011 0858   ALKPHOS 55 09/02/2011 0858   ALKPHOS 68 03/04/2011 0858   AST 13 09/02/2011 0858   AST 22 03/04/2011 0858   ALT 9 09/02/2011 0858   BILITOT 0.5 09/02/2011 0858   BILITOT 0.70 03/04/2011 0858     09/02/2011 CEA 0.7   Studies/Results: No results found.  Medications: I have reviewed the patient's current medications.  Assessment/Plan:  1. 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  2. Tubular adenomas resected from the right colon with additional multiple sessile polyps throughout the colon status post right hemicolectomy 05/14/2010.  3. Type 2 diabetes.  4. Essential hypertension.  5. Legal blindness.    Disposition-Mr. Dekoning appears stable. We are referring him for restaging CT scans in the next one week. Per Dr. Patsy Lager note dated 03/11/2011, if things look good on the upcoming CT scan, no further CT scans are planned, with additional studies based on clinical symptoms or physical findings. Mr. Starner will return for a followup visit in 6 months. He will contact the office in the interim with any problems.  Lonna Cobb ANP/GNP-BC

## 2011-09-16 ENCOUNTER — Ambulatory Visit (HOSPITAL_COMMUNITY)
Admission: RE | Admit: 2011-09-16 | Discharge: 2011-09-16 | Disposition: A | Discharge status: Home / Self Care | Payer: Medicare Other | Source: Ambulatory Visit | Attending: Nurse Practitioner | Admitting: Nurse Practitioner

## 2011-09-16 DIAGNOSIS — R911 Solitary pulmonary nodule: Secondary | ICD-10-CM | POA: Insufficient documentation

## 2011-09-16 DIAGNOSIS — Z85038 Personal history of other malignant neoplasm of large intestine: Secondary | ICD-10-CM | POA: Insufficient documentation

## 2011-09-16 DIAGNOSIS — K4020 Bilateral inguinal hernia, without obstruction or gangrene, not specified as recurrent: Secondary | ICD-10-CM | POA: Insufficient documentation

## 2011-10-10 ENCOUNTER — Telehealth: Payer: Self-pay | Admitting: *Deleted

## 2011-10-10 NOTE — Telephone Encounter (Signed)
Notified pt of CT results per Dr. Cyndie Chime.

## 2011-10-10 NOTE — Telephone Encounter (Signed)
Message copied by Sabino Snipes on Fri Oct 10, 2011  3:19 PM ------      Message from: Levert Feinstein      Created: Fri Oct 10, 2011 10:54 AM       Call pt CT negative for ca

## 2011-10-22 ENCOUNTER — Telehealth: Payer: Self-pay | Admitting: *Deleted

## 2011-10-22 NOTE — Telephone Encounter (Signed)
Received call from Barb/Dr Magod's office asking if pt need a repeat colonoscopy.  She reports that Dr Cyndie Chime was going to discuss with the pt at his May visit.  She can be reached at [phone] or [phone].  Note to Dr. Cyndie Chime.

## 2011-10-23 ENCOUNTER — Telehealth: Payer: Self-pay | Admitting: *Deleted

## 2011-10-23 NOTE — Telephone Encounter (Signed)
Barb/Dr.Magod's office called back today & message given per Dr. Cyndie Chime that a repeat colonoscopy is a judgement call & typically repeat colonoscopy done 1 year after surgery; he had a large lesion but it turned out to be non-invasive.  Informed that Dr. Cyndie Chime would defer to Dr. Marlane Hatcher judgement.

## 2012-01-07 ENCOUNTER — Other Ambulatory Visit: Payer: Self-pay | Admitting: Gastroenterology

## 2012-03-05 ENCOUNTER — Telehealth: Payer: Self-pay | Admitting: *Deleted

## 2012-03-05 NOTE — Telephone Encounter (Signed)
Received call from pt asking office to return call.  Returned call and spoke with pt; asked if he was suppose to have a CT scan next week with MD appt.  Informed pt that this RN did not see any  CT scan scheduled.  Pt verbalized understanding stated "I just wanted to be sure"  Confirmed appt 03/09/12 at 10:45 lab then MD at 11:15.

## 2012-03-09 ENCOUNTER — Ambulatory Visit (HOSPITAL_BASED_OUTPATIENT_CLINIC_OR_DEPARTMENT_OTHER): Payer: Medicare Other | Admitting: Oncology

## 2012-03-09 ENCOUNTER — Encounter: Payer: Self-pay | Admitting: Oncology

## 2012-03-09 ENCOUNTER — Other Ambulatory Visit (HOSPITAL_BASED_OUTPATIENT_CLINIC_OR_DEPARTMENT_OTHER): Payer: Medicare Other

## 2012-03-09 ENCOUNTER — Telehealth: Payer: Self-pay | Admitting: Oncology

## 2012-03-09 VITALS — BP 113/64 | HR 73 | Temp 97.2°F | Resp 20 | Wt 174.3 lb

## 2012-03-09 DIAGNOSIS — C184 Malignant neoplasm of transverse colon: Secondary | ICD-10-CM

## 2012-03-09 DIAGNOSIS — E119 Type 2 diabetes mellitus without complications: Secondary | ICD-10-CM

## 2012-03-09 DIAGNOSIS — Z85038 Personal history of other malignant neoplasm of large intestine: Secondary | ICD-10-CM

## 2012-03-09 DIAGNOSIS — H548 Legal blindness, as defined in USA: Secondary | ICD-10-CM

## 2012-03-09 DIAGNOSIS — C189 Malignant neoplasm of colon, unspecified: Secondary | ICD-10-CM

## 2012-03-09 DIAGNOSIS — E1122 Type 2 diabetes mellitus with diabetic chronic kidney disease: Secondary | ICD-10-CM

## 2012-03-09 DIAGNOSIS — I10 Essential (primary) hypertension: Secondary | ICD-10-CM

## 2012-03-09 DIAGNOSIS — D126 Benign neoplasm of colon, unspecified: Secondary | ICD-10-CM | POA: Insufficient documentation

## 2012-03-09 DIAGNOSIS — E113419 Type 2 diabetes mellitus with severe nonproliferative diabetic retinopathy with macular edema, unspecified eye: Secondary | ICD-10-CM | POA: Insufficient documentation

## 2012-03-09 HISTORY — DX: Benign neoplasm of colon, unspecified: D12.6

## 2012-03-09 HISTORY — DX: Type 2 diabetes mellitus with diabetic chronic kidney disease: E11.22

## 2012-03-09 HISTORY — DX: Malignant neoplasm of colon, unspecified: C18.9

## 2012-03-09 HISTORY — DX: Type 2 diabetes mellitus with severe nonproliferative diabetic retinopathy with macular edema, unspecified eye: E11.3419

## 2012-03-09 HISTORY — DX: Essential (primary) hypertension: I10

## 2012-03-09 LAB — CBC WITH DIFFERENTIAL/PLATELET
BASO%: 0.6 % (ref 0.0–2.0)
Basophils Absolute: 0 10*3/uL (ref 0.0–0.1)
EOS%: 2.7 % (ref 0.0–7.0)
HCT: 38.7 % (ref 38.4–49.9)
HGB: 13.2 g/dL (ref 13.0–17.1)
LYMPH%: 19.2 % (ref 14.0–49.0)
MCH: 32.4 pg (ref 27.2–33.4)
MCHC: 34.1 g/dL (ref 32.0–36.0)
NEUT%: 69.7 % (ref 39.0–75.0)
Platelets: 277 10*3/uL (ref 140–400)

## 2012-03-09 LAB — COMPREHENSIVE METABOLIC PANEL (CC13)
AST: 15 U/L (ref 5–34)
Albumin: 3.8 g/dL (ref 3.5–5.0)
Alkaline Phosphatase: 70 U/L (ref 40–150)
BUN: 25 mg/dL (ref 7.0–26.0)
Calcium: 9.3 mg/dL (ref 8.4–10.4)
Chloride: 102 mEq/L (ref 98–107)
Creatinine: 1.7 mg/dL — ABNORMAL HIGH (ref 0.7–1.3)
Glucose: 141 mg/dl — ABNORMAL HIGH (ref 70–99)

## 2012-03-09 NOTE — Telephone Encounter (Signed)
gv and printed pt appt schedule for OCT and APril.

## 2012-03-09 NOTE — Progress Notes (Signed)
Hematology and Oncology Follow Up Visit  Nathan Goodwin [id number] 1930-11-06 78 y.o. 03/09/2012 10:53 AM   Principle Diagnosis: Encounter Diagnosis  Name Primary?  . Colon cancer Yes     Interim History:   Followup visit for this pleasant 78 year old man who initially presented with anemia without guaiac-positive stools back in March 2011.  He was found to have a large lesion in the transverse colon.  He underwent initial surgical resection by Dr. Estelle Grumbles on 08/14/2009.  He was found to have a 6.5-cm grade II adenocarcinoma, 25 benign lymph nodes.  Tubular adenomas and a sessile tubulovillous adenoma also contained within the surgical specimen.  No vascular or lymphatic invasion.  Pathologic stage T3 N0 M0.   On a routine followup CT scan done 02/26/2010, there was a focal area of soft tissue density in the area of the hepatic flexure.  A repeat colonoscopy was done by Dr. Vida Rigger on 04/01/2010.  A number of small sessile polyps found in the sigmoid, descending, and proximal transverse colon removed by hot snare but, in addition, 1 large polyp at the hepatic flexure that could only be partially resected.   The patient was taken to surgery again on 05/14/2010 and a right hemicolectomy was performed.  Pathology showed 2 tubular adenomas, 3 cm and 1 cm with high-grade dysplasia but no invasion.  Fourteen additional lymph nodes taken at time of that surgery were negative.  The patient has been followed with observation alone.   He had a followup colonoscopy on 01/07/2012 by Dr. Ewing Schlein. There were no residual polyps.  Clinically he is doing well. He reports no abdominal pain or change in bowel habits. No hematochezia or melena.   Medications: reviewed  Allergies: No Known Allergies  Review of Systems: Constitutional:   No constitutional symptoms Respiratory: No cough or dyspnea Cardiovascular:  No chest pain or palpitations Gastrointestinal: See above Genito-Urinary: Not  questioned Musculoskeletal: No muscle or bone pain Neurologic: Chronic, poor vision. Skin: No rash Remaining ROS negative.  Physical Exam: Blood pressure 113/64, pulse 73, temperature 97.2 F (36.2 C), temperature source Oral, resp. rate 20, weight 174 lb 4.8 oz (79.062 kg). Wt Readings from Last 3 Encounters:  03/09/12 174 lb 4.8 oz (79.062 kg)  09/09/11 180 lb 8 oz (81.874 kg)     General appearance: Pleasant, elderly, African American man HENNT: Pharynx no erythema or exudate Lymph nodes: No adenopathy Breasts: Lungs: Clear to auscultation resonant to percussion Heart: Regular rhythm no murmur Abdomen: Soft, nontender, no mass, no organomegaly Extremities: No edema, no calf tenderness Vascular: No cyanosis Neurologic: Decreased vision both eyes Skin: No rash or ecchymosis  Lab Results: Lab Results  Component Value Date   WBC 7.7 03/09/2012   HGB 13.2 03/09/2012   HCT 38.7 03/09/2012   MCV 95.1 03/09/2012   PLT 277 03/09/2012     Chemistry      Component Value Date/Time   NA 137 09/02/2011 0858   NA 141 03/04/2011 0858   K 3.8 09/02/2011 0858   K 4.6 03/04/2011 0858   CL 103 09/02/2011 0858   CL 98 03/04/2011 0858   CO2 28 09/02/2011 0858   CO2 27 03/04/2011 0858   BUN 13 09/02/2011 0858   BUN 16 03/04/2011 0858   CREATININE 1.51* 09/02/2011 0858   CREATININE 1.8* 03/04/2011 0858      Component Value Date/Time   CALCIUM 9.2 09/02/2011 0858   CALCIUM 9.2 03/04/2011 0858   ALKPHOS 55 09/02/2011 0858  ALKPHOS 68 03/04/2011 0858   AST 13 09/02/2011 0858   AST 22 03/04/2011 0858   ALT 9 09/02/2011 0858   BILITOT 0.5 09/02/2011 0858   BILITOT 0.70 03/04/2011 0858       Impression and Plan:   1. Stage II, T3 N0 M0 adenocarcinoma of transverse colon, treated with surgery alone.  2. Tubular adenomas resected from the right colon with additional multiple sessile polyps throughout the colon.  3. Type 2 diabetes.  4. Essential hypertension.  5. Legal blindness.     I will get a CT scan of the abdomen to assess his liver which is a 6 month interval from his last scan. He will be reevaluated again in 6 months which will be the approximate 3 year anniversary from his original diagnosis. I will get an additional scan at that time. If this is negative then I think we can just follow him clinically and with labs over time.  CC:. Dr. Margaretmary Bayley; Dr. Viviann Spare gross; Dr. Vida Rigger   Levert Feinstein, MD 10/22/201310:53 AM

## 2012-03-15 ENCOUNTER — Ambulatory Visit (HOSPITAL_COMMUNITY)
Admission: RE | Admit: 2012-03-15 | Discharge: 2012-03-15 | Disposition: A | Discharge status: Home / Self Care | Payer: Medicare Other | Source: Ambulatory Visit | Attending: Oncology | Admitting: Oncology

## 2012-03-15 DIAGNOSIS — C189 Malignant neoplasm of colon, unspecified: Secondary | ICD-10-CM | POA: Insufficient documentation

## 2012-03-18 ENCOUNTER — Telehealth: Payer: Self-pay | Admitting: *Deleted

## 2012-03-18 NOTE — Telephone Encounter (Signed)
Message copied by Sabino Snipes on Thu Mar 18, 2012  4:20 PM ------      Message from: Levert Feinstein      Created: Mon Mar 15, 2012  9:42 AM       Call pt CT abdomen normal

## 2012-03-18 NOTE — Telephone Encounter (Signed)
Pt notified that CT results normal per Dr. Cyndie Chime.

## 2012-03-24 ENCOUNTER — Other Ambulatory Visit (HOSPITAL_COMMUNITY): Payer: Self-pay | Admitting: Urology

## 2012-03-24 DIAGNOSIS — C61 Malignant neoplasm of prostate: Secondary | ICD-10-CM

## 2012-04-09 ENCOUNTER — Encounter (HOSPITAL_COMMUNITY)
Admission: RE | Admit: 2012-04-09 | Discharge: 2012-04-09 | Disposition: A | Discharge status: Home / Self Care | Payer: Medicare Other | Source: Ambulatory Visit | Attending: Urology | Admitting: Urology

## 2012-04-09 DIAGNOSIS — C61 Malignant neoplasm of prostate: Secondary | ICD-10-CM | POA: Insufficient documentation

## 2012-04-09 DIAGNOSIS — M19019 Primary osteoarthritis, unspecified shoulder: Secondary | ICD-10-CM | POA: Insufficient documentation

## 2012-04-09 DIAGNOSIS — M1710 Unilateral primary osteoarthritis, unspecified knee: Secondary | ICD-10-CM | POA: Insufficient documentation

## 2012-04-09 MED ORDER — TECHNETIUM TC 99M MEDRONATE IV KIT
25.0000 | PACK | Freq: Once | INTRAVENOUS | Status: AC | PRN
  Administered 2012-04-09: 25 via INTRAVENOUS

## 2012-09-07 ENCOUNTER — Other Ambulatory Visit: Payer: Medicare Other | Admitting: Lab

## 2012-09-07 ENCOUNTER — Ambulatory Visit: Payer: Medicare Other | Admitting: Nurse Practitioner

## 2013-09-30 ENCOUNTER — Telehealth: Payer: Self-pay | Admitting: Family Medicine

## 2013-09-30 NOTE — Telephone Encounter (Signed)
Entered in error

## 2014-07-06 ENCOUNTER — Other Ambulatory Visit (HOSPITAL_COMMUNITY): Payer: Self-pay | Admitting: Urology

## 2014-07-06 DIAGNOSIS — C61 Malignant neoplasm of prostate: Secondary | ICD-10-CM

## 2014-07-14 ENCOUNTER — Encounter (HOSPITAL_COMMUNITY): Payer: Self-pay

## 2014-07-14 ENCOUNTER — Encounter (HOSPITAL_COMMUNITY)
Admission: RE | Admit: 2014-07-14 | Discharge: 2014-07-14 | Disposition: A | Discharge status: Home / Self Care | Payer: Medicare Other | Source: Ambulatory Visit | Attending: Urology | Admitting: Urology

## 2014-07-14 DIAGNOSIS — C61 Malignant neoplasm of prostate: Secondary | ICD-10-CM | POA: Insufficient documentation

## 2014-07-14 MED ORDER — TECHNETIUM TC 99M MEDRONATE IV KIT
25.0000 | PACK | Freq: Once | INTRAVENOUS | Status: AC | PRN
  Administered 2014-07-14: 25.7 via INTRAVENOUS

## 2016-06-07 DIAGNOSIS — Z23 Encounter for immunization: Secondary | ICD-10-CM | POA: Insufficient documentation

## 2016-06-07 DIAGNOSIS — Z7984 Long term (current) use of oral hypoglycemic drugs: Secondary | ICD-10-CM | POA: Insufficient documentation

## 2016-06-07 DIAGNOSIS — J019 Acute sinusitis, unspecified: Secondary | ICD-10-CM | POA: Insufficient documentation

## 2016-06-07 DIAGNOSIS — D649 Anemia, unspecified: Secondary | ICD-10-CM | POA: Insufficient documentation

## 2016-06-07 DIAGNOSIS — R109 Unspecified abdominal pain: Secondary | ICD-10-CM | POA: Insufficient documentation

## 2016-06-07 DIAGNOSIS — E113219 Type 2 diabetes mellitus with mild nonproliferative diabetic retinopathy with macular edema, unspecified eye: Secondary | ICD-10-CM | POA: Diagnosis not present

## 2016-06-07 DIAGNOSIS — R55 Syncope and collapse: Secondary | ICD-10-CM | POA: Diagnosis not present

## 2016-06-07 DIAGNOSIS — Z79899 Other long term (current) drug therapy: Secondary | ICD-10-CM | POA: Insufficient documentation

## 2016-06-07 DIAGNOSIS — I129 Hypertensive chronic kidney disease with stage 1 through stage 4 chronic kidney disease, or unspecified chronic kidney disease: Secondary | ICD-10-CM | POA: Insufficient documentation

## 2016-06-07 DIAGNOSIS — E1122 Type 2 diabetes mellitus with diabetic chronic kidney disease: Secondary | ICD-10-CM | POA: Diagnosis not present

## 2016-06-07 DIAGNOSIS — Z9049 Acquired absence of other specified parts of digestive tract: Secondary | ICD-10-CM | POA: Insufficient documentation

## 2016-06-07 DIAGNOSIS — E785 Hyperlipidemia, unspecified: Secondary | ICD-10-CM | POA: Insufficient documentation

## 2016-06-07 DIAGNOSIS — E860 Dehydration: Secondary | ICD-10-CM | POA: Diagnosis not present

## 2016-06-07 DIAGNOSIS — N182 Chronic kidney disease, stage 2 (mild): Secondary | ICD-10-CM | POA: Insufficient documentation

## 2016-06-08 ENCOUNTER — Emergency Department (HOSPITAL_COMMUNITY): Payer: Medicare Other

## 2016-06-08 ENCOUNTER — Observation Stay (HOSPITAL_COMMUNITY)
Admission: EM | Admit: 2016-06-08 | Discharge: 2016-06-09 | Disposition: A | Discharge status: Home / Self Care | Payer: Medicare Other | Attending: Internal Medicine | Admitting: Internal Medicine

## 2016-06-08 ENCOUNTER — Encounter (HOSPITAL_COMMUNITY): Payer: Self-pay | Admitting: Nurse Practitioner

## 2016-06-08 DIAGNOSIS — I10 Essential (primary) hypertension: Secondary | ICD-10-CM | POA: Diagnosis not present

## 2016-06-08 DIAGNOSIS — E118 Type 2 diabetes mellitus with unspecified complications: Secondary | ICD-10-CM

## 2016-06-08 DIAGNOSIS — E785 Hyperlipidemia, unspecified: Secondary | ICD-10-CM | POA: Diagnosis not present

## 2016-06-08 DIAGNOSIS — R55 Syncope and collapse: Secondary | ICD-10-CM | POA: Diagnosis not present

## 2016-06-08 DIAGNOSIS — E1122 Type 2 diabetes mellitus with diabetic chronic kidney disease: Secondary | ICD-10-CM

## 2016-06-08 DIAGNOSIS — E113419 Type 2 diabetes mellitus with severe nonproliferative diabetic retinopathy with macular edema, unspecified eye: Secondary | ICD-10-CM | POA: Diagnosis present

## 2016-06-08 DIAGNOSIS — D649 Anemia, unspecified: Secondary | ICD-10-CM

## 2016-06-08 DIAGNOSIS — N182 Chronic kidney disease, stage 2 (mild): Secondary | ICD-10-CM

## 2016-06-08 DIAGNOSIS — R109 Unspecified abdominal pain: Secondary | ICD-10-CM

## 2016-06-08 DIAGNOSIS — N289 Disorder of kidney and ureter, unspecified: Secondary | ICD-10-CM

## 2016-06-08 LAB — URINALYSIS, ROUTINE W REFLEX MICROSCOPIC
Bilirubin Urine: NEGATIVE
Hgb urine dipstick: NEGATIVE
KETONES UR: 20 mg/dL — AB
Leukocytes, UA: NEGATIVE
NITRITE: NEGATIVE
PH: 7 (ref 5.0–8.0)
Protein, ur: 30 mg/dL — AB
Specific Gravity, Urine: 1.021 (ref 1.005–1.030)

## 2016-06-08 LAB — CBC WITH DIFFERENTIAL/PLATELET
BASOS PCT: 0 %
Basophils Absolute: 0 10*3/uL (ref 0.0–0.1)
Eosinophils Absolute: 0.1 10*3/uL (ref 0.0–0.7)
Eosinophils Relative: 1 %
HCT: 32.7 % — ABNORMAL LOW (ref 39.0–52.0)
HEMOGLOBIN: 11 g/dL — AB (ref 13.0–17.0)
LYMPHS PCT: 16 %
Lymphs Abs: 1.5 10*3/uL (ref 0.7–4.0)
MCH: 31.2 pg (ref 26.0–34.0)
MCHC: 33.6 g/dL (ref 30.0–36.0)
MCV: 92.6 fL (ref 78.0–100.0)
MONOS PCT: 9 %
Monocytes Absolute: 0.9 10*3/uL (ref 0.1–1.0)
NEUTROS ABS: 7.3 10*3/uL (ref 1.7–7.7)
NEUTROS PCT: 74 %
Platelets: 261 10*3/uL (ref 150–400)
RBC: 3.53 MIL/uL — ABNORMAL LOW (ref 4.22–5.81)
RDW: 12.9 % (ref 11.5–15.5)
WBC: 9.8 10*3/uL (ref 4.0–10.5)

## 2016-06-08 LAB — COMPREHENSIVE METABOLIC PANEL
ALT: 9 U/L — AB (ref 17–63)
AST: 13 U/L — AB (ref 15–41)
Albumin: 4.2 g/dL (ref 3.5–5.0)
Alkaline Phosphatase: 49 U/L (ref 38–126)
Anion gap: 9 (ref 5–15)
BILIRUBIN TOTAL: 0.8 mg/dL (ref 0.3–1.2)
BUN: 19 mg/dL (ref 6–20)
CALCIUM: 8.8 mg/dL — AB (ref 8.9–10.3)
CO2: 26 mmol/L (ref 22–32)
CREATININE: 1.74 mg/dL — AB (ref 0.61–1.24)
Chloride: 99 mmol/L — ABNORMAL LOW (ref 101–111)
GFR, EST AFRICAN AMERICAN: 40 mL/min — AB (ref >60)
GFR, EST NON AFRICAN AMERICAN: 35 mL/min — AB (ref >60)
Glucose, Bld: 216 mg/dL — ABNORMAL HIGH (ref 65–99)
Potassium: 4.2 mmol/L (ref 3.5–5.1)
Sodium: 134 mmol/L — ABNORMAL LOW (ref 135–145)
Total Protein: 8 g/dL (ref 6.5–8.1)

## 2016-06-08 LAB — GLUCOSE, CAPILLARY
GLUCOSE-CAPILLARY: 158 mg/dL — AB (ref 65–99)
GLUCOSE-CAPILLARY: 178 mg/dL — AB (ref 65–99)
Glucose-Capillary: 164 mg/dL — ABNORMAL HIGH (ref 65–99)

## 2016-06-08 LAB — CBG MONITORING, ED: Glucose-Capillary: 225 mg/dL — ABNORMAL HIGH (ref 65–99)

## 2016-06-08 LAB — TROPONIN I

## 2016-06-08 LAB — LIPASE, BLOOD: LIPASE: 27 U/L (ref 11–51)

## 2016-06-08 MED ORDER — ATORVASTATIN CALCIUM 20 MG PO TABS
20.0000 mg | ORAL_TABLET | Freq: Every evening | ORAL | Status: DC
  Administered 2016-06-08: 20 mg via ORAL
  Filled 2016-06-08: qty 1

## 2016-06-08 MED ORDER — ONDANSETRON HCL 4 MG PO TABS: 4.0000 mg | ORAL_TABLET | Freq: Four times a day (QID) | ORAL | Status: DC | PRN

## 2016-06-08 MED ORDER — ACETAMINOPHEN 650 MG RE SUPP: 650.0000 mg | Freq: Four times a day (QID) | RECTAL | Status: DC | PRN

## 2016-06-08 MED ORDER — SODIUM CHLORIDE 0.9 % IV SOLN
INTRAVENOUS | Status: DC
  Administered 2016-06-08 (×2): via INTRAVENOUS

## 2016-06-08 MED ORDER — CALCIUM CARBONATE-VITAMIN D 500-200 MG-UNIT PO TABS
1.0000 | ORAL_TABLET | Freq: Every day | ORAL | Status: DC
  Administered 2016-06-08 – 2016-06-09 (×2): 1 via ORAL
  Filled 2016-06-08 (×2): qty 1

## 2016-06-08 MED ORDER — OXYMETAZOLINE HCL 0.05 % NA SOLN
1.0000 | Freq: Two times a day (BID) | NASAL | Status: DC
  Administered 2016-06-08 – 2016-06-09 (×3): 1 via NASAL
  Filled 2016-06-08: qty 15

## 2016-06-08 MED ORDER — GUAIFENESIN ER 600 MG PO TB12
600.0000 mg | ORAL_TABLET | Freq: Two times a day (BID) | ORAL | Status: DC
  Administered 2016-06-08 – 2016-06-09 (×3): 600 mg via ORAL
  Filled 2016-06-08 (×3): qty 1

## 2016-06-08 MED ORDER — TAMSULOSIN HCL 0.4 MG PO CAPS
0.4000 mg | ORAL_CAPSULE | Freq: Every day | ORAL | Status: DC
  Administered 2016-06-08 – 2016-06-09 (×2): 0.4 mg via ORAL
  Filled 2016-06-08 (×2): qty 1

## 2016-06-08 MED ORDER — MEGESTROL ACETATE 20 MG PO TABS
20.0000 mg | ORAL_TABLET | Freq: Every day | ORAL | Status: DC
  Administered 2016-06-08: 20 mg via ORAL
  Filled 2016-06-08: qty 1

## 2016-06-08 MED ORDER — EZETIMIBE-SIMVASTATIN 10-40 MG PO TABS
1.0000 | ORAL_TABLET | Freq: Every day | ORAL | Status: DC
  Administered 2016-06-08: 1 via ORAL
  Filled 2016-06-08: qty 1

## 2016-06-08 MED ORDER — GLUCERNA SHAKE PO LIQD
237.0000 mL | Freq: Two times a day (BID) | ORAL | Status: DC
  Administered 2016-06-08 – 2016-06-09 (×2): 237 mL via ORAL
  Filled 2016-06-08 (×3): qty 237

## 2016-06-08 MED ORDER — INSULIN ASPART 100 UNIT/ML ~~LOC~~ SOLN
0.0000 [IU] | Freq: Three times a day (TID) | SUBCUTANEOUS | Status: DC
  Administered 2016-06-08 (×2): 2 [IU] via SUBCUTANEOUS
  Administered 2016-06-09: 3 [IU] via SUBCUTANEOUS
  Administered 2016-06-09: 2 [IU] via SUBCUTANEOUS

## 2016-06-08 MED ORDER — PROPRANOLOL HCL 10 MG PO TABS: 20.0000 mg | ORAL_TABLET | Freq: Four times a day (QID) | ORAL | Status: DC | PRN

## 2016-06-08 MED ORDER — ALUM & MAG HYDROXIDE-SIMETH 200-200-20 MG/5ML PO SUSP: 10.0000 mL | Freq: Four times a day (QID) | ORAL | Status: DC | PRN

## 2016-06-08 MED ORDER — ENOXAPARIN SODIUM 40 MG/0.4ML ~~LOC~~ SOLN
40.0000 mg | SUBCUTANEOUS | Status: DC
  Administered 2016-06-08 – 2016-06-09 (×2): 40 mg via SUBCUTANEOUS
  Filled 2016-06-08 (×2): qty 0.4

## 2016-06-08 MED ORDER — ACETAMINOPHEN 325 MG PO TABS: 650.0000 mg | ORAL_TABLET | Freq: Four times a day (QID) | ORAL | Status: DC | PRN

## 2016-06-08 MED ORDER — PNEUMOCOCCAL VAC POLYVALENT 25 MCG/0.5ML IJ INJ
0.5000 mL | INJECTION | INTRAMUSCULAR | Status: AC
Start: 2016-06-09 — End: 2016-06-09
  Administered 2016-06-09: 0.5 mL via INTRAMUSCULAR
  Filled 2016-06-08: qty 0.5

## 2016-06-08 MED ORDER — FLUTICASONE PROPIONATE 50 MCG/ACT NA SUSP
1.0000 | Freq: Two times a day (BID) | NASAL | Status: DC
  Administered 2016-06-08 – 2016-06-09 (×3): 1 via NASAL
  Filled 2016-06-08: qty 16

## 2016-06-08 MED ORDER — IOPAMIDOL (ISOVUE-300) INJECTION 61%
15.0000 mL | Freq: Once | INTRAVENOUS | Status: AC | PRN
  Administered 2016-06-08: 15 mL via ORAL

## 2016-06-08 MED ORDER — ONDANSETRON HCL 4 MG/2ML IJ SOLN: 4.0000 mg | Freq: Four times a day (QID) | INTRAMUSCULAR | Status: DC | PRN

## 2016-06-08 NOTE — H&P (Addendum)
History and Physical    RYDIN BRADEN [id number] DOB: Sep 20, 1930 DOA: 06/08/2016  PCP: Foye Spurling, MD   Patient coming from: Home  Chief Complaint: Syncope  HPI: Nathan Goodwin is a 83 y.o. male with medical history significant of colon cancer sp resection and prostate cancer who presents with the chief complain of syncope x2. For the last 2 days patient has been having sinus congestion, post nasal dripping and cough. No fever or chills. 48 hours ago he took over the counter sinus medications, later that day he experienced a syncope episode while getting out of his bed and going to the bathroom. Before the syncope he felt nausea. The syncope episode was brief, but he was unable to stand back on his feet due to weakness, with difficulty he reached his bed. He has been in bed for the last 2 days due to weakness, poor appetite and patient's family has noted patient to have difficulty ambulating. Last night patient had another syncope episode under the same circunstances.    The syncope occurred while getting out of the bed, was brief in duration, preceded by nausea, no improving or worsening factors, associated with generalized weakness, no tonic clonic movements or tongue biting.   ED Course: Patient was noted to be dehydrates, started on IV fluids and referred for admission and further evaluation.   Review of Systems:  1. General. Positive for generalized weakness, no fevers were 2. ENT positive for sinus congestion, postnasal drip, and cough 3. Pulmonary no shortness of breath, no wheezing, no hemoptysis 4. Cardiovascular. No angina, no claudication, no PND or orthopnea 5. Gastrointestinal positive for nausea but no vomiting or diarrhea no abdominal pain 6. Skeletal no joint pain 7. Dermatology positive crash at the right pectoral region, with pruritus. 8. Endocrinology. No tremors, heat or cold intolerance 9. Hematology. No easy bruisability or frequent infections 10. Neurology no  seizures or paresthesias  Past Medical History:  Diagnosis Date  . Benign essential HTN 03/09/2012  . Colon cancer ([place])    colon ca dx 3/11  . Diabetes mellitus   . 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 Hypertension   . Prostate CA [facility])    prostate ca dx 1999  . Stage II carcinoma of colon ([place]) 03/09/2012   6.5 cm  Grade II adenoca transverse colon 25 nodes negative resected 08/14/09  . Tubular adenoma of colon 03/09/2012   Right hemicolectomy 05/14/10  . 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 No pertinent surgical history.   has no tobacco, alcohol, and drug history on file.  No Known Allergies  History reviewed. No pertinent family history. Unacceptable: Noncontributory, unremarkable, or negative. Acceptable: Family history reviewed and not pertinent (If you reviewed it)  Prior to Admission medications   Medication Sig Start Date End Date Taking? Authorizing Provider  alum & mag hydroxide-simeth (MAALOX PLUS) 400-400-40 MG/5ML suspension Take 10 mLs by mouth every 6 (six) hours as needed for indigestion.   Yes Historical Provider, MD  atorvastatin (LIPITOR) 20 MG tablet Take 20 mg by mouth every evening.   Yes Historical Provider, MD  calcium-vitamin D (OSCAL WITH D) 500-200 MG-UNIT per tablet Take 1 tablet by mouth daily.   Yes Historical Provider, MD  ezetimibe-simvastatin (VYTORIN) 10-40 MG per tablet Take 1 tablet by mouth at bedtime.   Yes Historical Provider, MD  losartan-hydrochlorothiazide (HYZAAR) 100-12.5 MG per tablet Take 1 tablet by mouth  daily.   Yes Historical Provider, MD  megestrol (MEGACE) 20 MG tablet Take 20 mg by mouth at bedtime.   Yes Historical Provider, MD  propranolol (INDERAL) 20 MG tablet Take 20 mg by mouth every 6 (six) hours as needed (tremors).   Yes Historical Provider, MD  sitaGLIPtin (JANUVIA) 100 MG tablet Take 100 mg by mouth daily.   Yes Historical  Provider, MD  tamsulosin (FLOMAX) 0.4 MG CAPS capsule Take 0.4 mg by mouth daily.   Yes Historical Provider, MD    Physical Exam: Vitals:   06/08/16 0600 06/08/16 0645 06/08/16 0700 06/08/16 0730  BP: 147/65 142/77 142/78 124/59  Pulse: 83 85 82 66  Resp: 12 13 12 20   Temp:      TempSrc:      SpO2: 97% 97% 98% 98%  Weight:          Constitutional: deconditioned Vitals:   06/08/16 0600 06/08/16 0645 06/08/16 0700 06/08/16 0730  BP: 147/65 142/77 142/78 124/59  Pulse: 83 85 82 66  Resp: 12 13 12 20   Temp:      TempSrc:      SpO2: 97% 97% 98% 98%  Weight:       Eyes: PERRL, lids and conjunctivae mild pale with no icterus.  Head normocephalic, nose and ears with no deformities.  ENMT: Mucous membranes are dry. Posterior pharynx clear of any exudate or lesions.Normal dentition.  Neck: normal, supple, no masses, no thyromegaly Respiratory: clear to auscultation bilaterally, no wheezing, no crackles. Normal respiratory effort. No accessory muscle use.  Cardiovascular: Regular rate and rhythm, no murmurs / rubs / gallops. No extremity edema. 2+ pedal pulses. No carotid bruits.  Abdomen: no tenderness, no masses palpated. No hepatosplenomegaly. Bowel sounds positive.  Musculoskeletal: no clubbing / cyanosis. No joint deformity upper and lower extremities. Good ROM, no contractures. Normal muscle tone.  Skin: no rashes, lesions, ulcers. No induration, Mild erythema on his right pectoral region.  Neurologic: CN 2-12 grossly intact. Sensation intact, DTR normal. Strength 5/5 in all 4.    Labs on Admission: I have personally reviewed following labs and imaging studies  CBC:  Recent Labs Lab 06/08/16 0113  WBC 9.8  NEUTROABS 7.3  HGB 11.0*  HCT 32.7*  MCV 92.6  PLT 0000000   Basic Metabolic Panel:  Recent Labs Lab 06/08/16 0113  NA 134*  K 4.2  CL 99*  CO2 26  GLUCOSE 216*  BUN 19  CREATININE 1.74*  CALCIUM 8.8*   GFR: CrCl cannot be calculated (Unknown ideal  weight.). Liver Function Tests:  Recent Labs Lab 06/08/16 0113  AST 13*  ALT 9*  ALKPHOS 49  BILITOT 0.8  PROT 8.0  ALBUMIN 4.2    Recent Labs Lab 06/08/16 0113  LIPASE 27   No results for input(s): AMMONIA in the last 168 hours. Coagulation Profile: No results for input(s): INR, PROTIME in the last 168 hours. Cardiac Enzymes:  Recent Labs Lab 06/08/16 0349  TROPONINI <0.03   BNP (last 3 results) No results for input(s): PROBNP in the last 8760 hours. HbA1C: No results for input(s): HGBA1C in the last 72 hours. CBG:  Recent Labs Lab 06/08/16 0125  GLUCAP 225*   Lipid Profile: No results for input(s): CHOL, HDL, LDLCALC, TRIG, CHOLHDL, LDLDIRECT in the last 72 hours. Thyroid Function Tests: No results for input(s): TSH, T4TOTAL, FREET4, T3FREE, THYROIDAB in the last 72 hours. Anemia Panel: No results for input(s): VITAMINB12, FOLATE, FERRITIN, TIBC, IRON, RETICCTPCT in the last 72 hours.  Urine analysis:    Component Value Date/Time   COLORURINE YELLOW 06/08/2016 0019   APPEARANCEUR CLEAR 06/08/2016 0019   LABSPEC 1.021 06/08/2016 0019   PHURINE 7.0 06/08/2016 0019   GLUCOSEU >=500 (A) 06/08/2016 0019   HGBUR NEGATIVE 06/08/2016 0019   BILIRUBINUR NEGATIVE 06/08/2016 0019   KETONESUR 20 (A) 06/08/2016 0019   PROTEINUR 30 (A) 06/08/2016 0019   UROBILINOGEN 0.2 07/18/2009 1131   NITRITE NEGATIVE 06/08/2016 0019   LEUKOCYTESUR NEGATIVE 06/08/2016 0019   Sepsis Labs: !!!!!!!!!!!!!!!!!!!!!!!!!!!!!!!!!!!!!!!!!!!! @LABRCNTIP (procalcitonin:4,lacticidven:4) )No results found for this or any previous visit (from the past 240 hour(s)).   Radiological Exams on Admission: Ct Abdomen Pelvis Wo Contrast  Result Date: 06/08/2016 CLINICAL DATA:  History of colon cancer epigastric abdominal pain EXAM: CT ABDOMEN AND PELVIS WITHOUT CONTRAST TECHNIQUE: Multidetector CT imaging of the abdomen and pelvis was performed following the standard protocol without IV contrast.  COMPARISON:  03/15/2012 FINDINGS: Lower chest: Lung bases demonstrate no acute consolidation or effusion. Normal heart size. Hepatobiliary: No focal liver abnormality is seen. No gallstones, gallbladder wall thickening, or biliary dilatation. Pancreas: Unremarkable. No pancreatic ductal dilatation or surrounding inflammatory changes. Spleen: Normal in size without focal abnormality. Adrenals/Urinary Tract: Adrenal glands within normal limits. Nonspecific perinephric fat stranding. No hydronephrosis. No calcifications. Bladder normal Stomach/Bowel: Stomach nonenlarged. The patient is status post partial colectomy with ileocolic anastomosis in the left abdomen. There is no evidence for obstruction. No bowel wall thickening. Vascular/Lymphatic: Aortic atherosclerosis. No enlarged abdominal or pelvic lymph nodes. Reproductive: Prostate is unremarkable. Other: Small fat in the inguinal canals.  No free air or free fluid. Musculoskeletal: No acute or suspicious bone lesions IMPRESSION: 1. No CT evidence for acute intra-abdominal or pelvic pathology 2. Stable postsurgical changes. Electronically Signed   By: Donavan Foil M.D.   On: 06/08/2016 04:20   Dg Chest Port 1 View  Result Date: 06/08/2016 CLINICAL DATA:  Syncope. EXAM: PORTABLE CHEST 1 VIEW COMPARISON:  Chest radiographs 09/16/2011 FINDINGS: The cardiomediastinal contours are normal. The lungs are clear. Pulmonary vasculature is normal. No consolidation, pleural effusion, or pneumothorax. No acute osseous abnormalities are seen. IMPRESSION: No active disease. Electronically Signed   By: Jeb Levering M.D.   On: 06/08/2016 06:54    EKG: Independently reviewed. Sinus rhythm, positive premature atrial complexes,no st elevations or depressions, no significant T wave abnormalities, normal intervals.  Assessment/Plan Active Problems:   Syncope  This is a 83 year old male who presents to the hospital with the chief complaint of syncope episode 2. The  episodes occur in the setting of a orthostatic scenario, patient was getting out bed trying to go to the bathroom. Over last 2 days patient has shown significant deconditioning and weakness related to a sinus infection. On his initial physical examination his blood pressure 139/64, heart rate 75, respiratory rate 12, oxygen saturation 97% on room air. Orthostatic vitals laying 142/77 with heart 74, sitting 149/57 with heart rate 74, standing 121/60 with heart rate 90. His conjunctivae slightly pale, his oral mucosa is dry, his lungs are clear to auscultation bilaterally, heart S1-S2 present, his abdomen is soft nontender, lower extremities there is no edema. He is nonfocal. Sodium 134, potassium 4.2, chloride 99, bicarbonate 26, glucose 216, BUN 19, creatinine 1.74, white count 9.8, hemoglobin 11.0, hematocrit 32.7, platelets 261. Urine analysis negative for infection. CT scan of the abdomen with pelvis showing no evidence of acute intra-abdominal or pelvic pathology. Chest x-ray personally review showing no infiltrates, effusions or any signs of pneumothorax.   Working  diagnosis: Syncope probably orthostatic related, rule out vasovagal.  1. Syncope. Will admit patient to the medical floor with a remote telemetry monitor, will start IV fluids with normal saline at 75 mL per hour, will check orthostatic vital signs every shift. Hold antihypertensive medications losartan/hydrochlorothiazide, will continue propanolol. Out of bed as tolerated, physical therapy evaluation. Note that patient's heart rate increased from 70-90 from laying to standing position.   2. Hypertension. Will continue hydration with isotonic solutions, will hold losartan/hydrochlorothiazide, continue propanolol.  3. Type 2 diabetes mellitus. Will hold oral hypoglycemic agents, will place patient on insulin sliding scale for glucose coverage and monitoring.  4. Dyslipidemia. Will continue the ezetimbide and simvastatin  5. Acute  sinusitis. Will start patient on sinus regimen with Flonase twice a day, Afrin for 3 days, guaifenesin as needed. Hold on antibiotics for now.     DVT prophylaxis: enoxaparin Code Status: Full  Family Communication: I spoke with patient's daughter at the bedside and all questions were addressed.  Disposition Plan: Home Consults called: NA Admission status: Observation   Mauricio Gerome Apley MD [facility] Pager [phone]  If 7PM-7AM, please contact night-coverage [email or web address] Password [email or web address]  06/08/2016, 8:00 AM

## 2016-06-08 NOTE — Progress Notes (Addendum)
Discussed with Dr. Roxanne Mins regarding Mr. Nathan Goodwin 83 year old male with PMH of colon cancer, DM type II, CKD stage III; who presents after having 2 syncopal episodes at home getting up to use the bathroom. Initial troponin negative and lab work otherwise near patient's baseline. Admit to a telemetry bed for observation. As the ED physician to obtain orthostatics and have ordered a portable chest x-ray.

## 2016-06-08 NOTE — ED Notes (Signed)
Pt is aware of the urine specimen needed but states he is unable to urine

## 2016-06-08 NOTE — ED Triage Notes (Signed)
Pt states he has been having abdominal problems that he describes and gas pains and recurrent "passing out." Reports last BM as today and nothing was unusual about it. Denies chest pain, shortness of breath or any other symptoms.

## 2016-06-08 NOTE — ED Notes (Signed)
Bed: WA06 Expected date:  Expected time:  Means of arrival:  Comments: 

## 2016-06-08 NOTE — Care Management Note (Signed)
Case Management Note  Patient Details  Name: Nathan Goodwin MRN: [id number] Date of Birth: 05/11/1931  Subjective/Objective:   Syncope, HTN                 Action/Plan: Discharge Planning: NCM spoke to pt at bedside. States he is ambulatory. Pt states he was out of town for 3 days and did not take his medications. States he is compliant with taking his medications. Pt states he over exerted himself by shoveling snow. Pt may benefit from [facility] RN for medication compliance, home safety evaluation and disease management. Offered choice for [facility]. Pt agreeable to [facility]. Pt has some concerns about how much it would cost. Will have NCM follow up with [facility] on 06/09/2016 on [facility] RN copay if arranged.   PCP  Jeanann Lewandowsky MD    Expected Discharge Date:  06/09/16               Expected Discharge Plan:  [place]  In-House Referral:  NA  Discharge planning Services  CM Consult  Post Acute Care Choice:  Home Health Choice offered to:  Patient  DME Arranged:    DME Agency:     HH Arranged:    [place] Agency:     Status of Service:  In process, will continue to follow  If discussed at Long Length of Stay Meetings, dates discussed:    Additional Comments:  Erenest Rasher, RN 06/08/2016, 5:45 PM

## 2016-06-08 NOTE — ED Provider Notes (Signed)
[place] DEPT Provider Note   CSN: [id number] Arrival date & time: 06/07/16  2338  By signing my name below, I, Collene Leyden, attest that this documentation has been prepared under the direction and in the presence of Delora Fuel, MD. Electronically Signed: Collene Leyden, Scribe. 06/08/16. 3:15 AM.   History   Chief Complaint Chief Complaint  Patient presents with  . Near Syncope  . Abdominal Pain    HPI Comments: Nathan Goodwin is a 83 y.o. male with a hx of colon cancer, who presents to the Emergency Department complaining of epigastric abdominal pain that began 3 days ago. Patient reports multiple abdominal pains. Patient states he has been passing out when he gets ready to use the bathroom, began 2 days ago. Patient states he has had 2 syncopal episodes. He has associated vomiting, and a productive cough. Patient describes the pain as "gassing pain". He reports taking gas x and Nyquil with no improvement. BMs improve the pain. He denies any nausea.    The history is provided by the patient. No language interpreter was used.    Past Medical History:  Diagnosis Date  . Benign essential HTN 03/09/2012  . Colon cancer ([place])    colon ca dx 3/11  . Diabetes mellitus   . 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 Hypertension   . Prostate CA [facility])    prostate ca dx 1999  . Stage II carcinoma of colon ([place]) 03/09/2012   6.5 cm  Grade II adenoca transverse colon 25 nodes negative resected 08/14/09  . Tubular adenoma of colon 03/09/2012   Right hemicolectomy 05/14/10  . 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 Stage II carcinoma of colon ([place]) 03/09/2012  . Tubular adenoma of colon 03/09/2012  . 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 Benign essential HTN 03/09/2012  . 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 H/O colon cancer, stage II 08/14/2009    History reviewed. No pertinent surgical history.     Home Medications    Prior to Admission medications   Medication Sig Start Date End Date Taking? Authorizing Provider  calcium-vitamin D (OSCAL WITH D) 500-200 MG-UNIT per tablet Take 1 tablet by mouth daily.    Historical Provider, MD  ezetimibe-simvastatin (VYTORIN) 10-40 MG per tablet Take 1 tablet by mouth at bedtime.    Historical Provider, MD  glimepiride (AMARYL) 4 MG tablet Take 4 mg by mouth daily before breakfast.    Historical Provider, MD  losartan-hydrochlorothiazide (HYZAAR) 100-12.5 MG per tablet Take 1 tablet by mouth daily.    Historical Provider, MD  nystatin-triamcinolone (MYCOLOG II) cream Apply 1 application topically 3 (three) times daily.  01/27/12   Historical Provider, MD  propranolol (INDERAL) 40 MG tablet Take 40 mg by mouth 2 (two) times daily.    Historical Provider, MD  sitaGLIPtin (JANUVIA) 100 MG tablet Take 100 mg by mouth daily.    Historical Provider, MD    Family History History reviewed. No pertinent family history.  Social History Social History  Substance Use Topics  . Smoking status: Not on file  . Smokeless tobacco: Not on file  . Alcohol use Not on file     Allergies   Patient has no known allergies.   Review of Systems Review of Systems  All other  systems reviewed and are negative.    Physical Exam Updated Vital Signs BP 139/64 (BP Location: Right Arm)   Pulse 75   Temp 98 F (36.7 C) (Oral)   Resp 12   Wt 162 lb 4 oz (73.6 kg)   SpO2 97%   Physical Exam  Constitutional: He is oriented to person, place, and time. He appears well-developed and well-nourished.  HENT:  Head: Normocephalic and atraumatic.  Eyes: EOM are normal. Pupils are equal, round, and reactive to light.  Neck: Normal range of motion. Neck supple. No JVD present.  Cardiovascular: Normal rate, regular rhythm and normal heart sounds.   No murmur  heard. Pulmonary/Chest: Effort normal and breath sounds normal. He has no wheezes. He has no rales. He exhibits no tenderness.  Abdominal: Soft. Bowel sounds are normal. He exhibits no distension and no mass. There is no tenderness.  Musculoskeletal: Normal range of motion. He exhibits no edema.  Lymphadenopathy:    He has no cervical adenopathy.  Neurological: He is alert and oriented to person, place, and time. No cranial nerve deficit. He exhibits normal muscle tone. Coordination normal.  Skin: Skin is warm and dry. No rash noted.  Psychiatric: He has a normal mood and affect. His behavior is normal. Judgment and thought content normal.  Nursing note and vitals reviewed.    ED Treatments / Results  DIAGNOSTIC STUDIES: Oxygen Saturation is 97% on RA, normal by my interpretation.    COORDINATION OF CARE: 3:13 AM Discussed treatment plan with pt at bedside and pt agreed to plan.  Labs (all labs ordered are listed, but only abnormal results are displayed) Labs Reviewed  COMPREHENSIVE METABOLIC PANEL - Abnormal; Notable for the following:       Result Value   Sodium 134 (*)    Chloride 99 (*)    Glucose, Bld 216 (*)    Creatinine, Ser 1.74 (*)    Calcium 8.8 (*)    AST 13 (*)    ALT 9 (*)    GFR calc non Af Amer 35 (*)    GFR calc Af Amer 40 (*)    All other components within normal limits  URINALYSIS, ROUTINE W REFLEX MICROSCOPIC - Abnormal; Notable for the following:    Glucose, UA >=500 (*)    Ketones, ur 20 (*)    Protein, ur 30 (*)    Bacteria, UA RARE (*)    Squamous Epithelial / LPF 0-5 (*)    All other components within normal limits  CBC WITH DIFFERENTIAL/PLATELET - Abnormal; Notable for the following:    RBC 3.53 (*)    Hemoglobin 11.0 (*)    HCT 32.7 (*)    All other components within normal limits  CBG MONITORING, ED - Abnormal; Notable for the following:    Glucose-Capillary 225 (*)    All other components within normal limits  LIPASE, BLOOD  TROPONIN I      EKG  EKG Interpretation  Date/Time:  Sunday June 08 2016 00:16:55 EST Ventricular Rate:  77 PR Interval:    QRS Duration: 89 QT Interval:  380 QTC Calculation: 430 R Axis:   44 Text Interpretation:  Sinus rhythm Premature atrial complexes Abnormal R-wave progression, early transition When compared with ECG of 07/18/2009, Premature atrial complexes are now present Confirmed by [facility]  MD, Alejandrina Raimer ([id number]) on 06/08/2016 3:02:27 AM       Radiology Ct Abdomen Pelvis Wo Contrast  Result Date: 06/08/2016 CLINICAL DATA:  History of colon cancer epigastric  abdominal pain EXAM: CT ABDOMEN AND PELVIS WITHOUT CONTRAST TECHNIQUE: Multidetector CT imaging of the abdomen and pelvis was performed following the standard protocol without IV contrast. COMPARISON:  03/15/2012 FINDINGS: Lower chest: Lung bases demonstrate no acute consolidation or effusion. Normal heart size. Hepatobiliary: No focal liver abnormality is seen. No gallstones, gallbladder wall thickening, or biliary dilatation. Pancreas: Unremarkable. No pancreatic ductal dilatation or surrounding inflammatory changes. Spleen: Normal in size without focal abnormality. Adrenals/Urinary Tract: Adrenal glands within normal limits. Nonspecific perinephric fat stranding. No hydronephrosis. No calcifications. Bladder normal Stomach/Bowel: Stomach nonenlarged. The patient is status post partial colectomy with ileocolic anastomosis in the left abdomen. There is no evidence for obstruction. No bowel wall thickening. Vascular/Lymphatic: Aortic atherosclerosis. No enlarged abdominal or pelvic lymph nodes. Reproductive: Prostate is unremarkable. Other: Small fat in the inguinal canals.  No free air or free fluid. Musculoskeletal: No acute or suspicious bone lesions IMPRESSION: 1. No CT evidence for acute intra-abdominal or pelvic pathology 2. Stable postsurgical changes. Electronically Signed   By: Donavan Foil M.D.   On: 06/08/2016 04:20     Procedures Procedures (including critical care time)  Medications Ordered in ED Medications  iopamidol (ISOVUE-300) 61 % injection 15 mL (15 mLs Oral Contrast Given 06/08/16 0322)     Initial Impression / Assessment and Plan / ED Course  I have reviewed the triage vital signs and the nursing notes.  Pertinent labs & imaging results that were available during my care of the patient were reviewed by me and considered in my medical decision making (see chart for details).  Upper abdominal pain of uncertain cause. Exam is benign. 2 episodes of syncope. It is unclear if these are related to defecation and micturition or unrelated. I am concerned about the lack of a prodrome which is suggestive of arrhythmia. He sent for CT of abdomen and pelvis which is unremarkable. Screening labs are obtained showing stable renal insufficiency and mild anemia. Old records are reviewed, and he has no relevant past visits. Case is discussed with Dr. Tamala Julian of [facility] who agrees to admit the patient under observation status.  Final Clinical Impressions(s) / ED Diagnoses   Final diagnoses:  Syncope  Syncope and collapse  Abdominal pain, unspecified abdominal location  Renal insufficiency  Normochromic normocytic anemia    New Prescriptions New Prescriptions   No medications on file   I personally performed the services described in this documentation, which was scribed in my presence. The recorded information has been reviewed and is accurate.       Delora Fuel, MD [id number]

## 2016-06-08 NOTE — Care Management Obs Status (Signed)
[place] NOTIFICATION   Patient Details  Name: Nathan Goodwin MRN: [id number] Date of Birth: [birth year]   Medicare Observation Status Notification Given:  Yes    Erenest Rasher, RN 06/08/2016, 5:54 PM

## 2016-06-09 DIAGNOSIS — R55 Syncope and collapse: Secondary | ICD-10-CM

## 2016-06-09 LAB — COMPREHENSIVE METABOLIC PANEL
ALT: 8 U/L — AB (ref 17–63)
AST: 13 U/L — AB (ref 15–41)
Albumin: 3.3 g/dL — ABNORMAL LOW (ref 3.5–5.0)
Alkaline Phosphatase: 44 U/L (ref 38–126)
Anion gap: 7 (ref 5–15)
BUN: 22 mg/dL — AB (ref 6–20)
CALCIUM: 8.3 mg/dL — AB (ref 8.9–10.3)
CHLORIDE: 104 mmol/L (ref 101–111)
CO2: 23 mmol/L (ref 22–32)
CREATININE: 1.43 mg/dL — AB (ref 0.61–1.24)
GFR, EST AFRICAN AMERICAN: 51 mL/min — AB (ref >60)
GFR, EST NON AFRICAN AMERICAN: 44 mL/min — AB (ref >60)
Glucose, Bld: 227 mg/dL — ABNORMAL HIGH (ref 65–99)
Potassium: 4.2 mmol/L (ref 3.5–5.1)
SODIUM: 134 mmol/L — AB (ref 135–145)
Total Bilirubin: 0.4 mg/dL (ref 0.3–1.2)
Total Protein: 6.4 g/dL — ABNORMAL LOW (ref 6.5–8.1)

## 2016-06-09 LAB — CBC
HCT: 29.4 % — ABNORMAL LOW (ref 39.0–52.0)
Hemoglobin: 9.8 g/dL — ABNORMAL LOW (ref 13.0–17.0)
MCH: 30.9 pg (ref 26.0–34.0)
MCHC: 33.3 g/dL (ref 30.0–36.0)
MCV: 92.7 fL (ref 78.0–100.0)
PLATELETS: 260 10*3/uL (ref 150–400)
RBC: 3.17 MIL/uL — ABNORMAL LOW (ref 4.22–5.81)
RDW: 12.8 % (ref 11.5–15.5)
WBC: 9.6 10*3/uL (ref 4.0–10.5)

## 2016-06-09 LAB — GLUCOSE, CAPILLARY
GLUCOSE-CAPILLARY: 205 mg/dL — AB (ref 65–99)
Glucose-Capillary: 198 mg/dL — ABNORMAL HIGH (ref 65–99)

## 2016-06-09 MED ORDER — FLUTICASONE PROPIONATE 50 MCG/ACT NA SUSP: 1.0000 | Freq: Two times a day (BID) | NASAL | 0 refills | Status: DC

## 2016-06-09 NOTE — Evaluation (Signed)
Physical Therapy Evaluation Patient Details Name: Nathan Goodwin MRN: [id number] DOB: 02-12-1931 Today's Date: 06/09/2016   History of Present Illness  AMANUEL MCGRUE is a 83 y.o. male with medical history significant of colon cancer sp resection and prostate cancer who presents with the chief complain of syncope x2  Clinical Impression  Patient evaluated by Physical Therapy with no further acute PT needs identified. All education has been completed and the patient has no further questions.  See below for any follow-up Physical Therapy or equipment needs. PT is signing off. Thank you for this referral.     Follow Up Recommendations No PT follow up    Equipment Recommendations  None recommended by PT    Recommendations for Other Services       Precautions / Restrictions Restrictions Weight Bearing Restrictions: No      Mobility  Bed Mobility Overal bed mobility: Modified Independent                Transfers Overall transfer level: Modified independent                  Ambulation/Gait Ambulation/Gait assistance: Supervision;Independent Ambulation Distance (Feet): 260 Feet Assistive device: None       General Gait Details: steady gait, no dizziness, slightly slower gait but incr speed with incr distance  Stairs            Wheelchair Mobility    Modified Rankin (Stroke Patients Only)       Balance     Sitting balance-Leahy Scale: Good                       High level balance activites: Backward walking;Direction changes;Turns;Sudden stops High Level Balance Comments: no LOB with above             Pertinent Vitals/Pain Pain Assessment: No/denies pain    Home Living Family/patient expects to be discharged to:: Private residence Living Arrangements: Spouse/significant other Available Help at Discharge: Family;Available PRN/intermittently Type of Home: House Home Access: Level entry     Home Layout: One level Home  Equipment: None      Prior Function Level of Independence: Independent         Comments: doesn't drive(d/t low vision), uses [employer] to go to the store/errands, etc.     Hand Dominance        Extremity/Trunk Assessment   Upper Extremity Assessment Upper Extremity Assessment: Overall WFL for tasks assessed;Defer to OT evaluation    Lower Extremity Assessment Lower Extremity Assessment: Overall WFL for tasks assessed       Communication   Communication: No difficulties  Cognition Arousal/Alertness: Awake/alert Behavior During Therapy: WFL for tasks assessed/performed Overall Cognitive Status: Within Functional Limits for tasks assessed                      General Comments      Exercises     Assessment/Plan    PT Assessment Patent does not need any further PT services  PT Problem List            PT Treatment Interventions      PT Goals (Current goals can be found in the Care Plan section)  Acute Rehab PT Goals Patient Stated Goal: home today  PT Goal Formulation: All assessment and education complete, DC therapy    Frequency     Barriers to discharge        Co-evaluation  End of Session Equipment Utilized During Treatment: Gait belt Activity Tolerance: Patient tolerated treatment well Patient left: in chair;with call bell/phone within reach;with chair alarm set;with family/visitor present      Functional Assessment Tool Used: clinical judgement Functional Limitation: Mobility: Walking and moving around Mobility: Walking and Moving Around Current Status [id number]): At least 1 percent but less than 20 percent impaired, limited or restricted Mobility: Walking and Moving Around Goal Status [phone]): At least 1 percent but less than 20 percent impaired, limited or restricted Mobility: Walking and Moving Around Discharge Status [phone]): 0 percent impaired, limited or restricted    Time: 1155-1208 PT Time Calculation (min)  (ACUTE ONLY): 13 min   Charges:   PT Evaluation $PT Eval Low Complexity: 1 Procedure     PT G Codes:   PT G-Codes **NOT FOR INPATIENT CLASS** Functional Assessment Tool Used: clinical judgement Functional Limitation: Mobility: Walking and moving around Mobility: Walking and Moving Around Current Status [id number]): At least 1 percent but less than 20 percent impaired, limited or restricted Mobility: Walking and Moving Around Goal Status [phone]): At least 1 percent but less than 20 percent impaired, limited or restricted Mobility: Walking and Moving Around Discharge Status [phone]): 0 percent impaired, limited or restricted    [facility] 06/09/2016, 12:16 PM

## 2016-06-09 NOTE — Discharge Instructions (Signed)
Syncope °Syncope is when you temporarily lose consciousness. Syncope may also be called fainting or passing out. It is caused by a sudden decrease in blood flow to the brain. Even though most causes of syncope are not dangerous, syncope can be a sign of a serious medical problem. Signs that you may be about to faint include: °· Feeling dizzy or light-headed. °· Feeling nauseous. °· Seeing all white or all black in your field of vision. °· Having cold, clammy skin. °If you fainted, get medical help right away.Call your local emergency services (911 in the [place] Do not drive yourself to the hospital. °Follow these instructions at home: °Pay attention to any changes in your symptoms. Take these actions to help with your condition: °· Have someone stay with you until you feel stable. °· Do not drive, use machinery, or play sports until your health care provider says it is okay. °· Keep all follow-up visits as told by your health care provider. This is important. °· If you start to feel like you might faint, lie down right away and raise (elevate) your feet above the level of your heart. Breathe deeply and steadily. Wait until all of the symptoms have passed. °· Drink enough fluid to keep your urine clear or pale yellow. °· If you are taking blood pressure or heart medicine, get up slowly and take several minutes to sit and then stand. This can reduce dizziness. °· Take over-the-counter and prescription medicines only as told by your health care provider. °Get help right away if: °· You have a severe headache. °· You have unusual pain in your chest, abdomen, or back. °· You are bleeding from your mouth or rectum, or you have black or tarry stool. °· You have a very fast or irregular heartbeat (palpitations). °· You have pain with breathing. °· You faint once or repeatedly. °· You have a seizure. °· You are confused. °· You have trouble walking. °· You have severe weakness. °· You have vision problems. °These symptoms  may represent a serious problem that is an emergency. Do not wait to see if your symptoms will go away. Get medical help right away. Call your local emergency services (911 in the [place] Do not drive yourself to the hospital. °This information is not intended to replace advice given to you by your health care provider. Make sure you discuss any questions you have with your health care provider. °Document Released: 05/05/2005 Document Revised: 10/11/2015 Document Reviewed: 01/17/2015 °Elsevier Interactive Patient Education © 2017 [employer] ° °

## 2016-06-09 NOTE — Discharge Summary (Signed)
Physician Discharge Summary  Nathan Goodwin [id number] DOB: 05-12-1931 DOA: 06/08/2016  PCP: Foye Spurling, MD  Admit date: 06/08/2016 Discharge date: 06/09/2016  Admitted From: Home Disposition:  Home  Recommendations for Outpatient Follow-up:  1. Follow up with PCP in 1 week 2. Please obtain BMP in 1 week   Home Health: No  Equipment/Devices: None   Discharge Condition: Stable CODE STATUS: Full  Diet recommendation: Heart healthy   Brief/Interim Summary: From H&P: Nathan Goodwin is a 83 y.o. male with medical history significant of colon cancer sp resection and prostate cancer who presents with the chief complain of syncope x2. For the last 2 days patient has been having sinus congestion, post nasal dripping and cough. No fever or chills. 48 hours ago he took Nyquil. Later that day, he experienced a syncopal episode while getting out of his bed and going to the bathroom. Before the syncope, he felt nausea but no chest pain or shortness of breath. The syncope episode was brief, but he was unable to stand back on his feet due to weakness, with difficulty he reached his bed. He has been in bed for the last 2 days due to weakness, poor appetite and patient's family has noted patient to have difficulty ambulating. Last night patient had another syncope episode under the same circumstance.   Interim: Patient has been monitored overnight and given IVF. He was initially orthostatic VS positive and his BP medications were held.   Subjective on day of discharge: Feeling well this morning. Denies any CP SOB, N/V/D/abdominal pain. No complaints on examination. Will work with PT today prior to discharge.   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 Follow up with PCP. No antibiotics for now.     Discharge Instructions  Discharge Instructions    Call MD for:  extreme fatigue    Complete by:  As directed    Call MD for:  persistant dizziness or light-headedness    Complete by:  As directed    Diet - low sodium heart healthy    Complete by:  As directed    Increase activity slowly    Complete by:  As directed      Allergies as of 06/09/2016   No Known Allergies     Medication List    STOP taking these medications   atorvastatin 20 MG tablet Commonly known as:  LIPITOR   losartan-hydrochlorothiazide 100-12.5 MG tablet Commonly known as:  HYZAAR     TAKE these medications   alum & mag hydroxide-simeth [id number] MG/5ML suspension Commonly known as:  MAALOX PLUS Take 10 mLs by mouth every 6 (six) hours as needed for indigestion.   calcium-vitamin D 500-200 MG-UNIT tablet Commonly known as:  OSCAL WITH D Take 1 tablet by mouth daily.   ezetimibe-simvastatin 10-40 MG tablet Commonly known as:  VYTORIN Take 1 tablet by mouth at bedtime.   fluticasone 50 MCG/ACT nasal spray Commonly known as:  FLONASE Place 1 spray into both nostrils 2 (two) times daily.   megestrol 20 MG tablet  Commonly known as:  MEGACE Take 20 mg by mouth at bedtime.   propranolol 20 MG tablet Commonly known as:  INDERAL Take 20 mg by mouth every 6 (six) hours as needed (tremors).   sitaGLIPtin 100 MG tablet Commonly known as:  JANUVIA Take 100 mg by mouth daily.   tamsulosin 0.4 MG Caps capsule Commonly known as:  FLOMAX Take 0.4 mg by mouth daily.      Follow-up Information    Foye Spurling, MD. Schedule an appointment as soon as possible for a visit in 1 week(s).   Specialty:  Internal Medicine Contact  information: [address] [place] [address] [phone]          No Known Allergies  Consultations:  None   Procedures/Studies: Ct Abdomen Pelvis Wo Contrast  Result Date: 06/08/2016 CLINICAL DATA:  History of colon cancer epigastric abdominal pain EXAM: CT ABDOMEN AND PELVIS WITHOUT CONTRAST TECHNIQUE: Multidetector CT imaging of the abdomen and pelvis was performed following the standard protocol without IV contrast. COMPARISON:  03/15/2012 FINDINGS: Lower chest: Lung bases demonstrate no acute consolidation or effusion. Normal heart size. Hepatobiliary: No focal liver abnormality is seen. No gallstones, gallbladder wall thickening, or biliary dilatation. Pancreas: Unremarkable. No pancreatic ductal dilatation or surrounding inflammatory changes. Spleen: Normal in size without focal abnormality. Adrenals/Urinary Tract: Adrenal glands within normal limits. Nonspecific perinephric fat stranding. No hydronephrosis. No calcifications. Bladder normal Stomach/Bowel: Stomach nonenlarged. The patient is status post partial colectomy with ileocolic anastomosis in the left abdomen. There is no evidence for obstruction. No bowel wall thickening. Vascular/Lymphatic: Aortic atherosclerosis. No enlarged abdominal or pelvic lymph nodes. Reproductive: Prostate is unremarkable. Other: Small fat in the inguinal canals.  No free air or free fluid. Musculoskeletal: No acute or suspicious bone lesions IMPRESSION: 1. No CT evidence for acute intra-abdominal or pelvic pathology 2. Stable postsurgical changes. Electronically Signed   By: Donavan Foil M.D.   On: 06/08/2016 04:20   Dg Chest Port 1 View  Result Date: 06/08/2016 CLINICAL DATA:  Syncope. EXAM: PORTABLE CHEST 1 VIEW COMPARISON:  Chest radiographs 09/16/2011 FINDINGS: The cardiomediastinal contours are normal. The lungs are clear. Pulmonary vasculature is normal. No consolidation, pleural effusion, or pneumothorax. No acute  osseous abnormalities are seen. IMPRESSION: No active disease. Electronically Signed   By: Jeb Levering M.D.   On: 06/08/2016 06:54       Discharge Exam: Vitals:   06/08/16 2030 06/09/16 0415  BP: 138/70 (!) 128/56  Pulse: 75 73  Resp: 16 16  Temp: 98.7 F (37.1 C) 99.3 F (37.4 C)   Vitals:   06/08/16 0830 06/08/16 1444 06/08/16 2030 06/09/16 0415  BP: 138/60  138/70 (!) 128/56  Pulse: 76  75 73  Resp: 11 16 16 16   Temp: 98.2 F (36.8 C) 99.2 F (37.3 C) 98.7 F (37.1 C) 99.3 F (37.4 C)  TempSrc: Oral Oral Oral Oral  SpO2: 98% 96% 98% 97%  Weight:      Height: 5\' 5"  (1.651 m)       General: Pt is alert, awake, not in acute distress Cardiovascular: RRR, S1/S2 +, no rubs, no gallops Respiratory: CTA bilaterally, no wheezing, no rhonchi Abdominal: Soft, NT, ND, bowel sounds + Extremities: no edema, no cyanosis    The results of significant diagnostics from this hospitalization (including imaging, microbiology, ancillary and laboratory) are listed below for reference.     Microbiology: No results found for this or any previous visit (from the past 240 hour(s)).  Labs: BNP (last 3 results) No results for input(s): BNP in the last 8760 hours. Basic Metabolic Panel:  Recent Labs Lab 06/08/16 0113 06/09/16 0442  NA 134* 134*  K 4.2 4.2  CL 99* 104  CO2 26 23  GLUCOSE 216* 227*  BUN 19 22*  CREATININE 1.74* 1.43*  CALCIUM 8.8* 8.3*   Liver Function Tests:  Recent Labs Lab 06/08/16 0113 06/09/16 0442  AST 13* 13*  ALT 9* 8*  ALKPHOS 49 44  BILITOT 0.8 0.4  PROT 8.0 6.4*  ALBUMIN 4.2 3.3*    Recent Labs Lab 06/08/16 0113  LIPASE 27   No results for input(s): AMMONIA in the last 168 hours. CBC:  Recent Labs Lab 06/08/16 0113 06/09/16 0442  WBC 9.8 9.6  NEUTROABS 7.3  --   HGB 11.0* 9.8*  HCT 32.7* 29.4*  MCV 92.6 92.7  PLT 261 260   Cardiac Enzymes:  Recent Labs Lab 06/08/16 0349  TROPONINI <0.03   BNP: Invalid  input(s): POCBNP CBG:  Recent Labs Lab 06/08/16 0125 06/08/16 1032 06/08/16 1648 06/08/16 2324 06/09/16 0746  GLUCAP 225* 158* 164* 178* 205*   D-Dimer No results for input(s): DDIMER in the last 72 hours. Hgb A1c No results for input(s): HGBA1C in the last 72 hours. Lipid Profile No results for input(s): CHOL, HDL, LDLCALC, TRIG, CHOLHDL, LDLDIRECT in the last 72 hours. Thyroid function studies No results for input(s): TSH, T4TOTAL, T3FREE, THYROIDAB in the last 72 hours.  Invalid input(s): FREET3 Anemia work up No results for input(s): VITAMINB12, FOLATE, FERRITIN, TIBC, IRON, RETICCTPCT in the last 72 hours. Urinalysis    Component Value Date/Time   COLORURINE YELLOW 06/08/2016 0019   APPEARANCEUR CLEAR 06/08/2016 0019   LABSPEC 1.021 06/08/2016 0019   PHURINE 7.0 06/08/2016 0019   GLUCOSEU >=500 (A) 06/08/2016 0019   HGBUR NEGATIVE 06/08/2016 0019   BILIRUBINUR NEGATIVE 06/08/2016 0019   KETONESUR 20 (A) 06/08/2016 0019   PROTEINUR 30 (A) 06/08/2016 0019   UROBILINOGEN 0.2 07/18/2009 1131   NITRITE NEGATIVE 06/08/2016 0019   LEUKOCYTESUR NEGATIVE 06/08/2016 0019   Sepsis Labs Invalid input(s): PROCALCITONIN,  WBC,  LACTICIDVEN Microbiology No results found for this or any previous visit (from the past 240 hour(s)).   Time coordinating discharge: Over 30 minutes  SIGNED:  Dessa Phi, DO [facility] Pager [phone]  If 7PM-7AM, please contact night-coverage [email or web address] Password [facility] 06/09/2016, 10:49 AM

## 2016-06-09 NOTE — Care Management Note (Signed)
Case Management Note  Patient Details  Name: Nathan Goodwin MRN: [id number] Date of Birth: January 24, 1931  Subjective/Objective:   Awaiting PT to eval for HHPT services if needed. [facility] rep [place] already following for [facility] order.MD notified. May need HHPT.                 Action/Plan:d/c home w/HHC.   Expected Discharge Date:  06/09/16               Expected Discharge Plan:  [place]  In-House Referral:  NA  Discharge planning Services  CM Consult  Post Acute Care Choice:  Home Health Choice offered to:  Patient  DME Arranged:    DME Agency:     HH Arranged:    [place] Agency:  [place]  Status of Service:  In process, will continue to follow  If discussed at Long Length of Stay Meetings, dates discussed:    Additional Comments:  Dessa Phi, RN 06/09/2016, 10:58 AM

## 2017-06-12 ENCOUNTER — Ambulatory Visit: Payer: Medicare Other | Admitting: Family Medicine

## 2017-06-12 ENCOUNTER — Encounter: Payer: Self-pay | Admitting: Family Medicine

## 2017-06-12 VITALS — BP 102/64 | HR 67 | Temp 98.5°F | Ht 65.0 in | Wt 158.4 lb

## 2017-06-12 DIAGNOSIS — I10 Essential (primary) hypertension: Secondary | ICD-10-CM

## 2017-06-12 DIAGNOSIS — H547 Unspecified visual loss: Secondary | ICD-10-CM

## 2017-06-12 DIAGNOSIS — N182 Chronic kidney disease, stage 2 (mild): Secondary | ICD-10-CM | POA: Diagnosis not present

## 2017-06-12 DIAGNOSIS — E7800 Pure hypercholesterolemia, unspecified: Secondary | ICD-10-CM

## 2017-06-12 DIAGNOSIS — E1122 Type 2 diabetes mellitus with diabetic chronic kidney disease: Secondary | ICD-10-CM

## 2017-06-12 DIAGNOSIS — C61 Malignant neoplasm of prostate: Secondary | ICD-10-CM | POA: Diagnosis not present

## 2017-06-12 DIAGNOSIS — Z85038 Personal history of other malignant neoplasm of large intestine: Secondary | ICD-10-CM

## 2017-06-12 LAB — LIPID PANEL
Cholesterol: 216 mg/dL — ABNORMAL HIGH (ref 0–200)
HDL: 39.8 mg/dL (ref >39.00)
NONHDL: 175.92
TRIGLYCERIDES: 203 mg/dL — AB (ref 0.0–149.0)
Total CHOL/HDL Ratio: 5
VLDL: 40.6 mg/dL — ABNORMAL HIGH (ref 0.0–40.0)

## 2017-06-12 LAB — CBC
HCT: 32 % — ABNORMAL LOW (ref 39.0–52.0)
HEMOGLOBIN: 10.8 g/dL — AB (ref 13.0–17.0)
MCHC: 33.8 g/dL (ref 30.0–36.0)
MCV: 94 fl (ref 78.0–100.0)
Platelets: 232 10*3/uL (ref 150.0–400.0)
RBC: 3.41 Mil/uL — ABNORMAL LOW (ref 4.22–5.81)
RDW: 13.1 % (ref 11.5–15.5)
WBC: 5.7 10*3/uL (ref 4.0–10.5)

## 2017-06-12 LAB — COMPREHENSIVE METABOLIC PANEL
ALK PHOS: 46 U/L (ref 39–117)
ALT: 9 U/L (ref 0–53)
AST: 14 U/L (ref 0–37)
Albumin: 3.9 g/dL (ref 3.5–5.2)
BILIRUBIN TOTAL: 0.4 mg/dL (ref 0.2–1.2)
BUN: 32 mg/dL — ABNORMAL HIGH (ref 6–23)
CO2: 29 meq/L (ref 19–32)
Calcium: 9.1 mg/dL (ref 8.4–10.5)
Chloride: 101 mEq/L (ref 96–112)
Creatinine, Ser: 1.76 mg/dL — ABNORMAL HIGH (ref 0.40–1.50)
GFR: 47.66 mL/min — AB (ref >60.00)
GLUCOSE: 133 mg/dL — AB (ref 70–99)
POTASSIUM: 4.1 meq/L (ref 3.5–5.1)
SODIUM: 138 meq/L (ref 135–145)
TOTAL PROTEIN: 7.1 g/dL (ref 6.0–8.3)

## 2017-06-12 LAB — MICROALBUMIN / CREATININE URINE RATIO
Creatinine,U: 149.4 mg/dL
Microalb Creat Ratio: 0.7 mg/g (ref 0.0–30.0)
Microalb, Ur: 1.1 mg/dL (ref 0.0–1.9)

## 2017-06-12 LAB — HEMOGLOBIN A1C: Hgb A1c MFr Bld: 6.2 % (ref 4.6–6.5)

## 2017-06-12 LAB — LDL CHOLESTEROL, DIRECT: Direct LDL: 139 mg/dL

## 2017-06-12 NOTE — Progress Notes (Addendum)
Subjective:  Patient ID: Nathan Goodwin, male    DOB: [birth year]  Age: 84 y.o. MRN: [id number]  CC: Establish Care   HPI BIAGIO Goodwin presents for establishment of care.  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  He is he is here for he is here for follow-up of his hypertension, diabetes and elevated cholesterol.  Significant past medical history of colon cancer and prostate cancer.  Medical record was reviewed.  Patient is nonfasting today.  Patient is living independently with his wife who is also legally blind.  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   He has no past surgical history on file.   His family history includes Arthritis in his mother; Depression in his mother; Diabetes in his brother, brother, brother, father, and mother; Heart attack in his brother and brother; Hyperlipidemia in his brother, brother, brother, daughter, and father; Hypertension in his brother, brother, brother, and mother; Mental illness in his mother.He reports that he has quit smoking. he has never used smokeless tobacco. His alcohol and drug histories are not on file.  Outpatient Medications Prior to Visit  Medication Sig Dispense Refill  . atorvastatin (LIPITOR) 20 MG tablet Take 20 mg by mouth daily.    . megestrol (MEGACE) 20 MG tablet Take 20 mg by mouth at bedtime as needed.    . metoprolol-hydrochlorothiazide (LOPRESSOR HCT) 100-25 MG tablet Take 1 tablet by mouth daily.    . sitaGLIPtin (JANUVIA) 50 MG tablet Take 50 mg by mouth daily.    Marland Kitchen alum & mag hydroxide-simeth (MAALOX PLUS)  400-400-40 MG/5ML suspension Take 10 mLs by mouth every 6 (six) hours as needed for indigestion.    . calcium-vitamin D (OSCAL WITH D) 500-200 MG-UNIT per tablet Take 1 tablet by mouth daily.    Marland Kitchen ezetimibe-simvastatin (VYTORIN) 10-40 MG per tablet Take 1 tablet by mouth at bedtime.    . megestrol (MEGACE) 20 MG tablet Take 20 mg by mouth at bedtime.    . propranolol (INDERAL) 20 MG tablet Take 20 mg by mouth every 6 (six) hours as needed (tremors).    . sitaGLIPtin (JANUVIA) 100 MG tablet Take 100 mg by mouth daily.    . tamsulosin (FLOMAX) 0.4 MG CAPS capsule Take 0.4 mg by mouth daily.    . fluticasone (FLONASE) 50 MCG/ACT nasal spray Place 1 spray into both nostrils 2 (two) times daily. 1 g 0   No facility-administered medications prior to visit.     ROS Review of Systems  Constitutional: Negative for chills and fever.  HENT: Negative.   Eyes: Positive for visual disturbance.  Respiratory: Negative.   Cardiovascular: Negative.   Gastrointestinal: Negative.   Endocrine: Negative for polyphagia and polyuria.  Musculoskeletal: Positive for arthralgias.  Skin: Negative.   Neurological: Negative for speech difficulty and headaches.  Psychiatric/Behavioral: Negative for behavioral problems and dysphoric mood. The patient is not hyperactive.     Objective:  BP 102/64 (BP Location: Left Arm, Patient Position: Sitting, Cuff Size: Normal)   Pulse 67   Temp 98.5 F (36.9 C) (Oral)   Ht 5\' 5"  (1.651 m)   Wt 158 lb 6 oz (71.8  kg)   SpO2 98%   BMI 26.35 kg/m   Physical Exam  Constitutional: He is oriented to person, place, and time. He appears well-developed and well-nourished. No distress.  HENT:  Head: Normocephalic and atraumatic.  Right Ear: External ear normal.  Left Ear: External ear normal.  Mouth/Throat: Oropharynx is clear and moist. No oropharyngeal exudate.  Eyes: Right eye exhibits no discharge. Left eye exhibits no discharge. No scleral icterus.  Neck: Neck supple. No  JVD present. No tracheal deviation present. No thyromegaly present.  Cardiovascular: Normal rate, regular rhythm and normal heart sounds.  Pulmonary/Chest: Effort normal and breath sounds normal. No stridor.  Abdominal: Bowel sounds are normal.  Lymphadenopathy:    He has no cervical adenopathy.  Neurological: He is alert and oriented to person, place, and time.  Skin: Skin is warm and dry. He is not diaphoretic.  Psychiatric: He has a normal mood and affect. His behavior is normal.      Assessment & Plan:   Tyshon was seen today for establish care.  Diagnoses and all orders for this visit:  Blind  Prostate cancer ([place])  Benign essential HTN -     CBC -     Comprehensive metabolic panel  Type 2 diabetes mellitus with stage 2 chronic kidney disease, without long-term current use of insulin (HCC) -     CBC -     Comprehensive metabolic panel -     Hemoglobin A1c -     Microalbumin / creatinine urine ratio  H/O colon cancer, stage II  Elevated cholesterol -     Comprehensive metabolic panel -     Lipid panel   I have discontinued Nathan Goodwin's ezetimibe-simvastatin, calcium-vitamin D, propranolol, tamsulosin, alum & mag hydroxide-simeth, and fluticasone. I am also having him maintain his atorvastatin, sitaGLIPtin, megestrol, and metoprolol-hydrochlorothiazide.  No orders of the defined types were placed in this encounter.  Patient's blood pressure is low today.  His metoprolol/HCTZ 100/25 pills are scored.  Have asked the patient to decrease his dosage to one half a pill daily and follow-up with me in 2 weeks.  And doing routine blood work for him today as indicated above.  Patient's daughter will help him break his pills and decrease his dosage to one half a pill a day.  Follow-up: Return in about 2 weeks (around 06/26/2017).  Libby Maw, MD

## 2017-06-15 ENCOUNTER — Encounter: Payer: Self-pay | Admitting: Family Medicine

## 2017-06-26 ENCOUNTER — Ambulatory Visit: Payer: Medicare Other | Admitting: Family Medicine

## 2017-06-26 ENCOUNTER — Encounter: Payer: Self-pay | Admitting: Family Medicine

## 2017-06-26 VITALS — BP 120/76 | HR 59 | Ht 65.0 in | Wt 157.1 lb

## 2017-06-26 DIAGNOSIS — E7800 Pure hypercholesterolemia, unspecified: Secondary | ICD-10-CM | POA: Diagnosis not present

## 2017-06-26 DIAGNOSIS — D539 Nutritional anemia, unspecified: Secondary | ICD-10-CM

## 2017-06-26 DIAGNOSIS — E1122 Type 2 diabetes mellitus with diabetic chronic kidney disease: Secondary | ICD-10-CM

## 2017-06-26 DIAGNOSIS — H547 Unspecified visual loss: Secondary | ICD-10-CM

## 2017-06-26 DIAGNOSIS — I10 Essential (primary) hypertension: Secondary | ICD-10-CM | POA: Diagnosis not present

## 2017-06-26 DIAGNOSIS — N182 Chronic kidney disease, stage 2 (mild): Secondary | ICD-10-CM | POA: Diagnosis not present

## 2017-06-26 DIAGNOSIS — D649 Anemia, unspecified: Secondary | ICD-10-CM | POA: Insufficient documentation

## 2017-06-26 LAB — GLUCOSE, POCT (MANUAL RESULT ENTRY): POC GLUCOSE: 163 mg/dL — AB (ref 70–99)

## 2017-06-26 MED ORDER — METOPROLOL-HCTZ ER 50-12.5 MG PO TB24: 1.0000 | ORAL_TABLET | ORAL | 1 refills | Status: DC

## 2017-06-26 MED ORDER — SITAGLIPTIN PHOSPHATE 100 MG PO TABS: 50.0000 mg | ORAL_TABLET | Freq: Every day | ORAL | 1 refills | Status: DC

## 2017-06-26 MED ORDER — ATORVASTATIN CALCIUM 20 MG PO TABS: 20.0000 mg | ORAL_TABLET | Freq: Every day | ORAL | 1 refills | Status: DC

## 2017-06-26 NOTE — Progress Notes (Signed)
Subjective:  Patient ID: Nathan Goodwin, male    DOB: 1930-06-27  Age: 84 y.o. MRN: [id number]  CC: Follow-up   HPI Nathan Goodwin presents for follow-up of his blood pressure and diabetes.  He is accompanied by his daughter.  Blood pressure is looking great day on the 50/12 and a half of the Lopressor HCTZ combination.  Hemoglobin A1c was 6.2 on Januvia 50.  Januvia 100 mg cost the same as the 50 mg pill.  He can break these in half.  Discussed LDL cholesterol being 139.  Was not entirely sure the patient has been taking the Lipitor daily.  His anemia has been stable.  It is on the macrocytic side.  Outpatient Medications Prior to Visit  Medication Sig Dispense Refill  . megestrol (MEGACE) 20 MG tablet Take 20 mg by mouth at bedtime as needed.    Marland Kitchen atorvastatin (LIPITOR) 20 MG tablet Take 20 mg by mouth daily.    . metoprolol-hydrochlorothiazide (LOPRESSOR HCT) 100-25 MG tablet Take 1 tablet by mouth daily.    . sitaGLIPtin (JANUVIA) 50 MG tablet Take 50 mg by mouth daily.     No facility-administered medications prior to visit.     ROS Review of Systems  Constitutional: Negative.   Respiratory: Negative.   Cardiovascular: Negative.   Gastrointestinal: Negative.  Negative for anal bleeding and blood in stool.  Endocrine: Negative for polyphagia and polyuria.  Neurological: Negative for headaches.  Hematological: Does not bruise/bleed easily.  Psychiatric/Behavioral: Negative.     Objective:  BP 120/76 (BP Location: Left Arm, Patient Position: Sitting, Cuff Size: Normal)   Pulse (!) 59   Ht 5\' 5"  (1.651 m)   Wt 157 lb 2 oz (71.3 kg)   SpO2 97%   BMI 26.15 kg/m   BP Readings from Last 3 Encounters:  06/26/17 120/76  06/12/17 102/64  06/09/16 (!) 128/56    Wt Readings from Last 3 Encounters:  06/26/17 157 lb 2 oz (71.3 kg)  06/12/17 158 lb 6 oz (71.8 kg)  06/07/16 162 lb 4 oz (73.6 kg)    Physical Exam  Constitutional: He is oriented to person, place, and time. He  appears well-developed and well-nourished. No distress.  Neck: No JVD present. No tracheal deviation present.  Pulmonary/Chest: Effort normal. No stridor.  Neurological: He is alert and oriented to person, place, and time.  Skin: Skin is warm and dry. He is not diaphoretic.  Psychiatric: He has a normal mood and affect. His behavior is normal.      Ct Abdomen Pelvis Wo Contrast  Result Date: 06/08/2016 CLINICAL DATA:  History of colon cancer epigastric abdominal pain EXAM: CT ABDOMEN AND PELVIS WITHOUT CONTRAST TECHNIQUE: Multidetector CT imaging of the abdomen and pelvis was performed following the standard protocol without IV contrast. COMPARISON:  03/15/2012 FINDINGS: Lower chest: Lung bases demonstrate no acute consolidation or effusion. Normal heart size. Hepatobiliary: No focal liver abnormality is seen. No gallstones, gallbladder wall thickening, or biliary dilatation. Pancreas: Unremarkable. No pancreatic ductal dilatation or surrounding inflammatory changes. Spleen: Normal in size without focal abnormality. Adrenals/Urinary Tract: Adrenal glands within normal limits. Nonspecific perinephric fat stranding. No hydronephrosis. No calcifications. Bladder normal Stomach/Bowel: Stomach nonenlarged. The patient is status post partial colectomy with ileocolic anastomosis in the left abdomen. There is no evidence for obstruction. No bowel wall thickening. Vascular/Lymphatic: Aortic atherosclerosis. No enlarged abdominal or pelvic lymph nodes. Reproductive: Prostate is unremarkable. Other: Small fat in the inguinal canals.  No free air or free  fluid. Musculoskeletal: No acute or suspicious bone lesions IMPRESSION: 1. No CT evidence for acute intra-abdominal or pelvic pathology 2. Stable postsurgical changes. Electronically Signed   By: Donavan Foil M.D.   On: 06/08/2016 04:20   Dg Chest Port 1 View  Result Date: 06/08/2016 CLINICAL DATA:  Syncope. EXAM: PORTABLE CHEST 1 VIEW COMPARISON:  Chest  radiographs 09/16/2011 FINDINGS: The cardiomediastinal contours are normal. The lungs are clear. Pulmonary vasculature is normal. No consolidation, pleural effusion, or pneumothorax. No acute osseous abnormalities are seen. IMPRESSION: No active disease. Electronically Signed   By: Jeb Levering M.D.   On: 06/08/2016 06:54    Assessment & Plan:   Nathan Goodwin was seen today for follow-up.  Diagnoses and all orders for this visit:  Benign essential HTN -     Metoprolol-Hydrochlorothiazide 50-12.5 MG TB24; Take 1 tablet by mouth every morning.  Type 2 diabetes mellitus with stage 2 chronic kidney disease, without long-term current use of insulin (HCC) -     sitaGLIPtin (JANUVIA) 100 MG tablet; Take 0.5 tablets (50 mg total) by mouth daily. -     POC Glucose (CBG)  Blind  Elevated LDL cholesterol level -     atorvastatin (LIPITOR) 20 MG tablet; Take 1 tablet (20 mg total) by mouth daily.  Macrocytic anemia  Medical regimen as above.  Follow-up in 3 months. I have discontinued [name] C. Lundblad's sitaGLIPtin and metoprolol-hydrochlorothiazide. I have also changed his atorvastatin. Additionally, I am having him start on Metoprolol-Hydrochlorothiazide and sitaGLIPtin. Lastly, I am having him maintain his megestrol.  Meds ordered this encounter  Medications  . atorvastatin (LIPITOR) 20 MG tablet    Sig: Take 1 tablet (20 mg total) by mouth daily.    Dispense:  90 tablet    Refill:  1  . Metoprolol-Hydrochlorothiazide 50-12.5 MG TB24    Sig: Take 1 tablet by mouth every morning.    Dispense:  90 tablet    Refill:  1  . sitaGLIPtin (JANUVIA) 100 MG tablet    Sig: Take 0.5 tablets (50 mg total) by mouth daily.    Dispense:  90 tablet    Refill:  1     Follow-up: Return in about 3 months (around 09/23/2017).  Libby Maw, MD

## 2017-07-08 ENCOUNTER — Encounter: Payer: Self-pay | Admitting: Family Medicine

## 2017-09-23 ENCOUNTER — Encounter: Payer: Self-pay | Admitting: Family Medicine

## 2017-09-23 ENCOUNTER — Ambulatory Visit (INDEPENDENT_AMBULATORY_CARE_PROVIDER_SITE_OTHER): Payer: Medicare Other

## 2017-09-23 ENCOUNTER — Ambulatory Visit: Payer: Medicare Other | Admitting: Family Medicine

## 2017-09-23 VITALS — BP 124/64 | HR 78 | Ht 65.0 in | Wt 159.2 lb

## 2017-09-23 DIAGNOSIS — N182 Chronic kidney disease, stage 2 (mild): Secondary | ICD-10-CM | POA: Diagnosis not present

## 2017-09-23 DIAGNOSIS — I10 Essential (primary) hypertension: Secondary | ICD-10-CM

## 2017-09-23 DIAGNOSIS — G8929 Other chronic pain: Secondary | ICD-10-CM

## 2017-09-23 DIAGNOSIS — M25562 Pain in left knee: Secondary | ICD-10-CM

## 2017-09-23 DIAGNOSIS — E1122 Type 2 diabetes mellitus with diabetic chronic kidney disease: Secondary | ICD-10-CM | POA: Diagnosis not present

## 2017-09-23 DIAGNOSIS — E7800 Pure hypercholesterolemia, unspecified: Secondary | ICD-10-CM | POA: Diagnosis not present

## 2017-09-23 DIAGNOSIS — M179 Osteoarthritis of knee, unspecified: Secondary | ICD-10-CM | POA: Insufficient documentation

## 2017-09-23 DIAGNOSIS — D539 Nutritional anemia, unspecified: Secondary | ICD-10-CM

## 2017-09-23 DIAGNOSIS — M1710 Unilateral primary osteoarthritis, unspecified knee: Secondary | ICD-10-CM | POA: Insufficient documentation

## 2017-09-23 DIAGNOSIS — D519 Vitamin B12 deficiency anemia, unspecified: Secondary | ICD-10-CM

## 2017-09-23 LAB — LDL CHOLESTEROL, DIRECT: Direct LDL: 128 mg/dL

## 2017-09-23 LAB — BASIC METABOLIC PANEL
BUN: 28 mg/dL — AB (ref 6–23)
CALCIUM: 9.1 mg/dL (ref 8.4–10.5)
CO2: 27 mEq/L (ref 19–32)
Chloride: 103 mEq/L (ref 96–112)
Creatinine, Ser: 1.91 mg/dL — ABNORMAL HIGH (ref 0.40–1.50)
GFR: 43.34 mL/min — AB (ref >60.00)
GLUCOSE: 153 mg/dL — AB (ref 70–99)
Potassium: 4.2 mEq/L (ref 3.5–5.1)
Sodium: 138 mEq/L (ref 135–145)

## 2017-09-23 LAB — B12 AND FOLATE PANEL
Folate: 12.2 ng/mL (ref >5.9)
Vitamin B-12: 167 pg/mL — ABNORMAL LOW (ref 211–911)

## 2017-09-23 LAB — CBC
HEMATOCRIT: 31.8 % — AB (ref 39.0–52.0)
HEMOGLOBIN: 10.8 g/dL — AB (ref 13.0–17.0)
MCHC: 33.9 g/dL (ref 30.0–36.0)
MCV: 94.3 fl (ref 78.0–100.0)
PLATELETS: 255 10*3/uL (ref 150.0–400.0)
RBC: 3.38 Mil/uL — AB (ref 4.22–5.81)
RDW: 13.6 % (ref 11.5–15.5)
WBC: 6.1 10*3/uL (ref 4.0–10.5)

## 2017-09-23 LAB — MICROALBUMIN / CREATININE URINE RATIO
Creatinine,U: 185.4 mg/dL
Microalb Creat Ratio: 0.9 mg/g (ref 0.0–30.0)
Microalb, Ur: 1.6 mg/dL (ref 0.0–1.9)

## 2017-09-23 LAB — HEMOGLOBIN A1C: HEMOGLOBIN A1C: 6.4 % (ref 4.6–6.5)

## 2017-09-23 MED ORDER — METOPROLOL-HCTZ ER 50-12.5 MG PO TB24: 1.0000 | ORAL_TABLET | ORAL | 1 refills | Status: DC

## 2017-09-23 MED ORDER — PIOGLITAZONE HCL 15 MG PO TABS: 15.0000 mg | ORAL_TABLET | Freq: Every day | ORAL | 1 refills | Status: DC

## 2017-09-23 NOTE — Progress Notes (Signed)
Subjective:  Patient ID: CON ARGANBRIGHT, male    DOB: 26-Jul-1930  Age: 84 y.o. MRN: [id number]  CC: Follow-up   HPI Nathan Goodwin presents for follow-up of his hypertension, diabetes, CKD, macrocytic anemia, elevated LDL cholesterol and chronic left knee pain.  Blood pressure has been well controlled on his current regimen.  He is having no problem with the medicine he is taking.  Diabetes is been under good control with Januvia he has been taking it for years.  With his chronic kidney disease Actos may be a better choice at this point.  He was agreeable to at least trying the Actos.  He has no history of CHF.  He has had no problems taking metformin in the past he tells me.  Last check of his LDL cholesterol showed it to be elevated to 130.  He claims compliance with his Lipitor daily.  He has a long-standing history of chronic left knee pain and wonders whether or not a steroid injection would be indicated for him.  To date his diabetes has been under control and I do not see that that would be a problem.  Outpatient Medications Prior to Visit  Medication Sig Dispense Refill  . atorvastatin (LIPITOR) 20 MG tablet Take 1 tablet (20 mg total) by mouth daily. 90 tablet 1  . megestrol (MEGACE) 20 MG tablet Take 20 mg by mouth at bedtime as needed.    . Metoprolol-Hydrochlorothiazide 50-12.5 MG TB24 Take 1 tablet by mouth every morning. 90 tablet 1  . sitaGLIPtin (JANUVIA) 100 MG tablet Take 0.5 tablets (50 mg total) by mouth daily. 90 tablet 1   No facility-administered medications prior to visit.     ROS Review of Systems  Constitutional: Negative.  Negative for diaphoresis, fever and unexpected weight change.  HENT: Negative.   Eyes: Negative.   Respiratory: Negative.  Negative for chest tightness and shortness of breath.   Cardiovascular: Negative.  Negative for chest pain and palpitations.  Gastrointestinal: Negative.   Endocrine: Negative for polyphagia and polyuria.  Genitourinary:  Negative for hematuria and urgency.  Musculoskeletal: Positive for arthralgias and gait problem.  Skin: Negative for color change and pallor.  Allergic/Immunologic: Negative for immunocompromised state.  Neurological: Negative for weakness and headaches.  Hematological: Does not bruise/bleed easily.  Psychiatric/Behavioral: Negative.     Objective:  BP 124/64   Pulse 78   Ht 5\' 5"  (1.651 m)   Wt 159 lb 4 oz (72.2 kg)   SpO2 97%   BMI 26.50 kg/m   BP Readings from Last 3 Encounters:  09/23/17 124/64  06/26/17 120/76  06/12/17 102/64    Wt Readings from Last 3 Encounters:  09/23/17 159 lb 4 oz (72.2 kg)  06/26/17 157 lb 2 oz (71.3 kg)  06/12/17 158 lb 6 oz (71.8 kg)    Physical Exam  Constitutional: He is oriented to person, place, and time. He appears well-developed and well-nourished. No distress.  HENT:  Head: Normocephalic and atraumatic.  Right Ear: External ear normal.  Left Ear: External ear normal.  Eyes: Conjunctivae are normal. Right eye exhibits no discharge. Left eye exhibits no discharge.  Neck: Neck supple. No JVD present. No tracheal deviation present. No thyromegaly present.  Cardiovascular: Normal rate, regular rhythm and normal heart sounds.  Occasional extrasystoles are present.  Pulmonary/Chest: Effort normal and breath sounds normal. He has no rales.  Abdominal: Bowel sounds are normal.  Musculoskeletal: He exhibits no edema.       Left  knee: He exhibits swelling. He exhibits no effusion. No tenderness found.  Lymphadenopathy:    He has no cervical adenopathy.  Neurological: He is alert and oriented to person, place, and time.  Skin: Skin is warm and dry. He is not diaphoretic.  Psychiatric: He has a normal mood and affect. His behavior is normal. Thought content normal.      Ct Abdomen Pelvis Wo Contrast  Result Date: 06/08/2016 CLINICAL DATA:  History of colon cancer epigastric abdominal pain EXAM: CT ABDOMEN AND PELVIS WITHOUT CONTRAST  TECHNIQUE: Multidetector CT imaging of the abdomen and pelvis was performed following the standard protocol without IV contrast. COMPARISON:  03/15/2012 FINDINGS: Lower chest: Lung bases demonstrate no acute consolidation or effusion. Normal heart size. Hepatobiliary: No focal liver abnormality is seen. No gallstones, gallbladder wall thickening, or biliary dilatation. Pancreas: Unremarkable. No pancreatic ductal dilatation or surrounding inflammatory changes. Spleen: Normal in size without focal abnormality. Adrenals/Urinary Tract: Adrenal glands within normal limits. Nonspecific perinephric fat stranding. No hydronephrosis. No calcifications. Bladder normal Stomach/Bowel: Stomach nonenlarged. The patient is status post partial colectomy with ileocolic anastomosis in the left abdomen. There is no evidence for obstruction. No bowel wall thickening. Vascular/Lymphatic: Aortic atherosclerosis. No enlarged abdominal or pelvic lymph nodes. Reproductive: Prostate is unremarkable. Other: Small fat in the inguinal canals.  No free air or free fluid. Musculoskeletal: No acute or suspicious bone lesions IMPRESSION: 1. No CT evidence for acute intra-abdominal or pelvic pathology 2. Stable postsurgical changes. Electronically Signed   By: Donavan Foil M.D.   On: 06/08/2016 04:20   Dg Chest Port 1 View  Result Date: 06/08/2016 CLINICAL DATA:  Syncope. EXAM: PORTABLE CHEST 1 VIEW COMPARISON:  Chest radiographs 09/16/2011 FINDINGS: The cardiomediastinal contours are normal. The lungs are clear. Pulmonary vasculature is normal. No consolidation, pleural effusion, or pneumothorax. No acute osseous abnormalities are seen. IMPRESSION: No active disease. Electronically Signed   By: Jeb Levering M.D.   On: 06/08/2016 06:54    Assessment & Plan:   Piper was seen today for follow-up.  Diagnoses and all orders for this visit:  Benign essential HTN -     Discontinue: Metoprolol-Hydrochlorothiazide 50-12.5 MG TB24; Take 1  tablet by mouth every morning. -     CBC -     Basic metabolic panel -     Microalbumin / creatinine urine ratio -     Urinalysis, Routine w reflex microscopic -     metoprolol succinate (TOPROL-XL) 50 MG 24 hr tablet; Take 1 tablet (50 mg total) by mouth daily. Take with or immediately following a meal.  Type 2 diabetes mellitus with stage 2 chronic kidney disease, without long-term current use of insulin (HCC) -     pioglitazone (ACTOS) 15 MG tablet; Take 1 tablet (15 mg total) by mouth daily. -     CBC -     Basic metabolic panel -     Microalbumin / creatinine urine ratio -     Hemoglobin A1c -     Urinalysis, Routine w reflex microscopic  Macrocytic anemia -     CBC -     B12 and Folate Panel  Chronic pain of left knee -     Ambulatory referral to Sports Medicine -     DG Knee Complete 4 Views Left; Future -     DG Knee Complete 4 Views Left  Elevated LDL cholesterol level -     Direct LDL  Anemia due to vitamin B12 deficiency, unspecified B12 deficiency  type -     cyanocobalamin ([employer] VITAMIN B12) 2000 MCG tablet; Take 1 tablet (2,000 mcg total) by mouth daily.   I have discontinued Nathan Goodwin's megestrol, Metoprolol-Hydrochlorothiazide, sitaGLIPtin, and Metoprolol-Hydrochlorothiazide. I am also having him start on pioglitazone, cyanocobalamin, and metoprolol succinate. Additionally, I am having him maintain his atorvastatin.  Meds ordered this encounter  Medications  . DISCONTD: Metoprolol-Hydrochlorothiazide 50-12.5 MG TB24    Sig: Take 1 tablet by mouth every morning.    Dispense:  90 tablet    Refill:  1  . pioglitazone (ACTOS) 15 MG tablet    Sig: Take 1 tablet (15 mg total) by mouth daily.    Dispense:  90 tablet    Refill:  1  . cyanocobalamin ([employer] VITAMIN B12) 2000 MCG tablet    Sig: Take 1 tablet (2,000 mcg total) by mouth daily.    Dispense:  100 tablet    Refill:  1  . metoprolol succinate (TOPROL-XL) 50 MG 24 hr tablet    Sig: Take 1 tablet (50 mg  total) by mouth daily. Take with or immediately following a meal.    Dispense:  90 tablet    Refill:  1   May need to increase his Lipitor pending lipid studies today.  His hemoglobin has been depressed but stable.  It is on the macrocytic side.  B12 and folate checked today.  Follow-up in 3 months.  Metoprolol/hctz on back order. Will switch to succinate.   Follow-up: Return in about 3 months (around 12/24/2017).  Libby Maw, MD

## 2017-09-24 ENCOUNTER — Telehealth: Payer: Self-pay

## 2017-09-24 DIAGNOSIS — D519 Vitamin B12 deficiency anemia, unspecified: Secondary | ICD-10-CM | POA: Insufficient documentation

## 2017-09-24 MED ORDER — CYANOCOBALAMIN 2000 MCG PO TABS: 2000.0000 ug | ORAL_TABLET | Freq: Every day | ORAL | 1 refills | Status: DC

## 2017-09-24 MED ORDER — METOPROLOL SUCCINATE ER 50 MG PO TB24: 50.0000 mg | ORAL_TABLET | Freq: Every day | ORAL | 1 refills | Status: DC

## 2017-09-24 NOTE — Telephone Encounter (Signed)
Received a fax from the pharmacy that the Dutoprol 50mg /12.5mg  tablet is not covered by patient's insurance and the generic is not available. Is there another substitute you would like to send in?

## 2017-09-24 NOTE — Telephone Encounter (Signed)
New Rx sent to pharmacy

## 2017-09-29 ENCOUNTER — Ambulatory Visit: Payer: Medicare Other | Admitting: Family Medicine

## 2017-09-29 ENCOUNTER — Encounter: Payer: Self-pay | Admitting: Family Medicine

## 2017-09-29 ENCOUNTER — Ambulatory Visit: Payer: Self-pay

## 2017-09-29 VITALS — BP 138/78 | HR 69 | Temp 98.6°F | Ht 65.0 in | Wt 159.0 lb

## 2017-09-29 DIAGNOSIS — M1712 Unilateral primary osteoarthritis, left knee: Secondary | ICD-10-CM

## 2017-09-29 DIAGNOSIS — M25562 Pain in left knee: Secondary | ICD-10-CM

## 2017-09-29 NOTE — Patient Instructions (Signed)
Nice to meet you   Take tylenol 650 mg three times a day is the best evidence based medicine we have for arthritis.   Glucosamine sulfate 750mg  twice a day is a supplement that has been shown to help moderate to severe arthritis.  Vitamin D 2000 IU daily  Fish oil 2 grams daily.   Tumeric 500mg  twice daily.   Capsaicin topically up to four times a day may also help with pain.  Cortisone injections are an option if these interventions do not seem to make a difference or need more relief.   If cortisone injections do not help, there are different types of shots that may help but they take longer to take effect.  We can discuss this at follow up.   It's important that you continue to stay active.  Please follow up with me in 4-6 weeks if there is no improvement.

## 2017-09-29 NOTE — Assessment & Plan Note (Signed)
Pain likely related to his arthritis.  - counseled on supportive care  - pennsaid sample provided  - counseled on exercise  - if no improvement consider injection or PT

## 2017-09-29 NOTE — Progress Notes (Signed)
Nathan Goodwin - 84 y.o. male MRN [id number]  Date of birth: Feb 22, 1931  SUBJECTIVE:  Including CC & ROS.  Chief Complaint  Patient presents with  . Left knee pain    Nathan Goodwin is a 84 y.o. male that is presenting with left knee pain. Pain is acute on chronic in nature. Walking and standing exacerbate the pain.  Pain is located on the medial aspect of his left knee. Pain is throbbing in nature when he ambulates.  Admits to a popping sensation when he walks.  He admits to limping at times due to the pain. He does not use any assistance when walking.  Denies surgeries or injuries. Denies swelling. Has not received a CSI before.   Independent review of the left knee x-ray from 5/8 shows bone-on-bone in the medial compartment.   Review of Systems  Constitutional: Negative for fever.  HENT: Negative for congestion.   Respiratory: Negative for cough.   Cardiovascular: Negative for chest pain.  Gastrointestinal: Negative for abdominal pain.  Musculoskeletal: Positive for arthralgias.  Skin: Negative for color change.  Neurological: Negative for weakness.  Hematological: Negative for adenopathy.  Psychiatric/Behavioral: Negative for agitation.    HISTORY: Past Medical, Surgical, Social, and Family History Reviewed & Updated per EMR.   Pertinent Historical Findings include:  Past Medical History:  Diagnosis Date  . Allergy   . Arthritis   . Benign essential HTN 03/09/2012  . Chicken pox   . Colon cancer ([place])    colon ca dx 3/11  . Diabetes mellitus   . DM type 2 causing CKD stage 2 ([place]) 03/09/2012  . Hyperlipidemia   . Hypertension   . Prostate CA [facility])    prostate ca dx 1999  . Stage II carcinoma of colon ([place]) 03/09/2012   6.5 cm  Grade II adenoca transverse colon 25 nodes negative resected 08/14/09  . Tubular adenoma of colon 03/09/2012   Right hemicolectomy 05/14/10  . 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  No Known Allergies  Family History  Problem Relation Age of Onset  . Arthritis Mother   . Depression Mother   . Diabetes Mother   . Hypertension Mother   . Mental illness Mother   . Diabetes Father   . Hyperlipidemia Father   . Diabetes Brother   . Heart attack Brother   . Hyperlipidemia Brother   . Hypertension Brother   . Hyperlipidemia Daughter   . Diabetes Brother   . Heart attack Brother   . Hyperlipidemia Brother   . Hypertension Brother   . Hyperlipidemia Brother   . Hypertension Brother   . Diabetes Brother      Social History   Socioeconomic History  . Marital status: Married    Spouse name: Not on file  . Number of children: Not on file  . Years of education: Not on file  . Highest education level: Not on file  Occupational History  . Not on file  Social Needs  . Financial resource strain: Not on file  . Food insecurity:    Worry: Not on file    Inability: Not on file  . Transportation needs:    Medical: Not on file    Non-medical: Not on file  Tobacco Use  . Smoking status: Former Research scientist (life sciences)  . Smokeless tobacco: Never Used  Substance and Sexual Activity  . Alcohol use: Not on file  .  Drug use: Not on file  . Sexual activity: Not on file  Lifestyle  . Physical activity:    Days per week: Not on file    Minutes per session: Not on file  . Stress: Not on file  Relationships  . Social connections:    Talks on phone: Not on file    Gets together: Not on file    Attends religious service: Not on file    Active member of club or organization: Not on file    Attends meetings of clubs or organizations: Not on file    Relationship status: Not on file  . Intimate partner violence:    Fear of current or ex partner: Not on file    Emotionally abused: Not on file    Physically abused: Not on file    Forced sexual activity: Not on file  Other Topics Concern  . Not on file  Social History Narrative  . Not on file      PHYSICAL EXAM:  VS: BP 138/78 (BP Location: Left Arm, Patient Position: Sitting, Cuff Size: Normal)   Pulse 69   Temp 98.6 F (37 C) (Oral)   Ht 5\' 5"  (1.651 m)   Wt 159 lb (72.1 kg)   SpO2 98%   BMI 26.46 kg/m  Physical Exam Gen: NAD, alert, cooperative with exam, well-appearing ENT: normal lips, normal nasal mucosa,  Eye: normal conjunctiva and lids CV:  no edema, +2 pedal pulses   Resp: no accessory muscle use, non-labored,  Skin: no rashes, no areas of induration  Neuro: normal tone, normal sensation to touch Psych:  normal insight, alert and oriented MSK:  Left Knee: Normal to inspection with no erythema or effusion or obvious bony abnormalities. Palpation normal with no warmth patellar tenderness, or condyle tenderness. TTP of the medial joint line.  ROM full in flexion and extension and lower leg rotation. Mild instability with valgus testing  Negative Mcmurray's Non painful patellar compression. Patellar glide without crepitus. Patellar and quadriceps tendons unremarkable. Hamstring and quadriceps strength is normal.  Neurovascularly intact       ASSESSMENT & PLAN:   OA (osteoarthritis) of knee Pain likely related to his arthritis.  - counseled on supportive care  - pennsaid sample provided  - counseled on exercise  - if no improvement consider injection or PT

## 2017-12-09 ENCOUNTER — Ambulatory Visit: Payer: Medicare Other | Admitting: Family Medicine

## 2017-12-09 ENCOUNTER — Encounter: Payer: Self-pay | Admitting: Family Medicine

## 2017-12-09 VITALS — BP 130/64 | HR 75 | Temp 98.2°F | Ht 65.0 in

## 2017-12-09 DIAGNOSIS — E538 Deficiency of other specified B group vitamins: Secondary | ICD-10-CM | POA: Diagnosis not present

## 2017-12-09 DIAGNOSIS — T7840XA Allergy, unspecified, initial encounter: Secondary | ICD-10-CM

## 2017-12-09 MED ORDER — B COMPLEX VITAMINS PO CAPS: 1.0000 | ORAL_CAPSULE | Freq: Every day | ORAL | 1 refills | Status: AC

## 2017-12-09 MED ORDER — PREDNISONE 10 MG PO TABS: 10.0000 mg | ORAL_TABLET | Freq: Two times a day (BID) | ORAL | 0 refills | Status: AC

## 2017-12-09 NOTE — Progress Notes (Signed)
Subjective:  Patient ID: Nathan Goodwin, male    DOB: June 25, 1930  Age: 84 y.o. MRN: [id number]  CC: left eye swollen (patient was cutting the grass, daughter unsure if its sunburn or allergic reaction)   HPI Nathan Goodwin presents for evaluation of swelling on his face.  This began last week after he wipe the sweat off of his brow.  He had previously used the rag to clean the underside of the deck of his riding lawnmower.  Rash is slowly improving.  He did seem to move off the left side of his face more than the right.  It extended down into his anterior neck.  He is tried over-the-counter 1% hydrocortisone cream and some Allegra that he had leftover.  He is having no swelling in his throat or difficulty breathing.  Outpatient Medications Prior to Visit  Medication Sig Dispense Refill  . alfuzosin (UROXATRAL) 10 MG 24 hr tablet TK 1 T PO QD  3  . cyanocobalamin ([employer] VITAMIN B12) 2000 MCG tablet Take 1 tablet (2,000 mcg total) by mouth daily. 100 tablet 1  . megestrol (MEGACE) 20 MG tablet TK 1 T PO  BID PRN  11  . metoprolol succinate (TOPROL-XL) 50 MG 24 hr tablet Take 1 tablet (50 mg total) by mouth daily. Take with or immediately following a meal. 90 tablet 1  . pioglitazone (ACTOS) 15 MG tablet Take 1 tablet (15 mg total) by mouth daily. 90 tablet 1   No facility-administered medications prior to visit.     ROS Review of Systems  Constitutional: Negative for fatigue, fever and unexpected weight change.  HENT: Negative.   Eyes: Negative for photophobia and visual disturbance.  Respiratory: Negative for chest tightness, shortness of breath and wheezing.   Cardiovascular: Negative for chest pain.  Gastrointestinal: Negative.   Musculoskeletal: Negative.   Skin: Positive for color change and rash. Negative for pallor and wound.  Neurological: Negative for dizziness, light-headedness and headaches.  Hematological: Does not bruise/bleed easily.  Psychiatric/Behavioral: Negative.      Objective:  BP 130/64   Pulse 75   Temp 98.2 F (36.8 C)   Ht 5\' 5"  (1.651 m)   SpO2 98%   BMI 26.46 kg/m   BP Readings from Last 3 Encounters:  12/09/17 130/64  09/29/17 138/78  09/23/17 124/64    Wt Readings from Last 3 Encounters:  09/29/17 159 lb (72.1 kg)  09/23/17 159 lb 4 oz (72.2 kg)  06/26/17 157 lb 2 oz (71.3 kg)    Physical Exam  Constitutional: He appears well-developed and well-nourished. No distress.  HENT:  Head: Normocephalic and atraumatic.  Right Ear: External ear normal.  Left Ear: External ear normal.  Mouth/Throat: Oropharynx is clear and moist. No oropharyngeal exudate.  Eyes: Pupils are equal, round, and reactive to light. Conjunctivae and EOM are normal. Right eye exhibits no discharge. Left eye exhibits no discharge.  Neck: No JVD present.  Pulmonary/Chest: Effort normal.  Skin: Skin is warm. Rash noted. He is not diaphoretic. There is erythema. No pallor.     Psychiatric: He has a normal mood and affect. His behavior is normal.      Ct Abdomen Pelvis Wo Contrast  Result Date: 06/08/2016 CLINICAL DATA:  History of colon cancer epigastric abdominal pain EXAM: CT ABDOMEN AND PELVIS WITHOUT CONTRAST TECHNIQUE: Multidetector CT imaging of the abdomen and pelvis was performed following the standard protocol without IV contrast. COMPARISON:  03/15/2012 FINDINGS: Lower chest: Lung bases demonstrate no acute  consolidation or effusion. Normal heart size. Hepatobiliary: No focal liver abnormality is seen. No gallstones, gallbladder wall thickening, or biliary dilatation. Pancreas: Unremarkable. No pancreatic ductal dilatation or surrounding inflammatory changes. Spleen: Normal in size without focal abnormality. Adrenals/Urinary Tract: Adrenal glands within normal limits. Nonspecific perinephric fat stranding. No hydronephrosis. No calcifications. Bladder normal Stomach/Bowel: Stomach nonenlarged. The patient is status post partial colectomy with  ileocolic anastomosis in the left abdomen. There is no evidence for obstruction. No bowel wall thickening. Vascular/Lymphatic: Aortic atherosclerosis. No enlarged abdominal or pelvic lymph nodes. Reproductive: Prostate is unremarkable. Other: Small fat in the inguinal canals.  No free air or free fluid. Musculoskeletal: No acute or suspicious bone lesions IMPRESSION: 1. No CT evidence for acute intra-abdominal or pelvic pathology 2. Stable postsurgical changes. Electronically Signed   By: Donavan Foil M.D.   On: 06/08/2016 04:20   Dg Chest Port 1 View  Result Date: 06/08/2016 CLINICAL DATA:  Syncope. EXAM: PORTABLE CHEST 1 VIEW COMPARISON:  Chest radiographs 09/16/2011 FINDINGS: The cardiomediastinal contours are normal. The lungs are clear. Pulmonary vasculature is normal. No consolidation, pleural effusion, or pneumothorax. No acute osseous abnormalities are seen. IMPRESSION: No active disease. Electronically Signed   By: Jeb Levering M.D.   On: 06/08/2016 06:54    Assessment & Plan:   Devyon was seen today for left eye swollen.  Diagnoses and all orders for this visit:  Allergic reaction, initial encounter -     predniSONE (DELTASONE) 10 MG tablet; Take 1 tablet (10 mg total) by mouth 2 (two) times daily with a meal for 7 days.  B12 deficiency -     b complex vitamins capsule; Take 1 capsule by mouth daily.   I am having [name] start on b complex vitamins and predniSONE. I am also having him maintain his pioglitazone, cyanocobalamin, metoprolol succinate, alfuzosin, and megestrol.  Meds ordered this encounter  Medications  . b complex vitamins capsule    Sig: Take 1 capsule by mouth daily.    Dispense:  100 capsule    Refill:  1  . predniSONE (DELTASONE) 10 MG tablet    Sig: Take 1 tablet (10 mg total) by mouth 2 (two) times daily with a meal for 7 days.    Dispense:  14 tablet    Refill:  0   Patient will continue low-dose hydrocortisone cream.  He will start the  prednisone at low dose 10 mg twice a day.  They never filled the vitamin B complex that I discussed with them at her last clinic visit.  His daughter is present again today.  I stressed the importance of them needing to fill the vitamin and take it please he will be following up in 1 month.  Follow-up: Return return for scheduled appointment on 8/7. Please start your Bcomplex sent!Libby Maw, MD

## 2017-12-09 NOTE — Patient Instructions (Signed)
Vitamin B12 Deficiency Vitamin B12 deficiency means that your body is not getting enough vitamin B12. Your body needs vitamin B12 for important bodily functions. If you do not have enough vitamin B12 in your body, you can have health problems. Follow these instructions at home:  Take supplements only as told by your doctor. Follow the directions carefully.  Get any shots (injections) as told by your doctor. Do not miss your visits to the doctor.  Eat lots of healthy foods that contain vitamin B12. Ask your doctor if you should work with someone who is trained in how food affects health (dietitian). Foods that contain vitamin B12 include: ? Meat. ? Meat from birds (poultry). ? Fish. ? Eggs. ? Cereal and dairy products that are fortified. This means that vitamin B12 has been added to the food. Check the label on the package to see if the food is fortified.  Do not drink too much (do not abuse) alcohol.  Keep all follow-up visits as told by your doctor. This is important. Contact a doctor if:  Your symptoms come back. Get help right away if:  You have trouble breathing.  You have chest pain.  You get dizzy.  You pass out (lose consciousness). This information is not intended to replace advice given to you by your health care provider. Make sure you discuss any questions you have with your health care provider. Document Released: 04/24/2011 Document Revised: 10/11/2015 Document Reviewed: 09/20/2014 Elsevier Interactive Patient Education  2018 [employer]  

## 2017-12-24 ENCOUNTER — Ambulatory Visit: Payer: Medicare Other | Admitting: Family Medicine

## 2017-12-24 ENCOUNTER — Encounter: Payer: Self-pay | Admitting: Family Medicine

## 2017-12-24 VITALS — BP 136/68 | HR 80 | Temp 98.4°F | Ht 65.0 in | Wt 159.2 lb

## 2017-12-24 DIAGNOSIS — N183 Chronic kidney disease, stage 3 unspecified: Secondary | ICD-10-CM

## 2017-12-24 DIAGNOSIS — N182 Chronic kidney disease, stage 2 (mild): Secondary | ICD-10-CM | POA: Diagnosis not present

## 2017-12-24 DIAGNOSIS — D539 Nutritional anemia, unspecified: Secondary | ICD-10-CM | POA: Diagnosis not present

## 2017-12-24 DIAGNOSIS — I10 Essential (primary) hypertension: Secondary | ICD-10-CM | POA: Diagnosis not present

## 2017-12-24 DIAGNOSIS — E538 Deficiency of other specified B group vitamins: Secondary | ICD-10-CM | POA: Diagnosis not present

## 2017-12-24 DIAGNOSIS — E1122 Type 2 diabetes mellitus with diabetic chronic kidney disease: Secondary | ICD-10-CM | POA: Diagnosis not present

## 2017-12-24 DIAGNOSIS — D519 Vitamin B12 deficiency anemia, unspecified: Secondary | ICD-10-CM

## 2017-12-24 DIAGNOSIS — E7800 Pure hypercholesterolemia, unspecified: Secondary | ICD-10-CM

## 2017-12-24 LAB — CBC
HCT: 32.2 % — ABNORMAL LOW (ref 39.0–52.0)
HEMOGLOBIN: 10.8 g/dL — AB (ref 13.0–17.0)
MCHC: 33.4 g/dL (ref 30.0–36.0)
MCV: 96 fl (ref 78.0–100.0)
Platelets: 218 10*3/uL (ref 150.0–400.0)
RBC: 3.36 Mil/uL — ABNORMAL LOW (ref 4.22–5.81)
RDW: 13.8 % (ref 11.5–15.5)
WBC: 5.7 10*3/uL (ref 4.0–10.5)

## 2017-12-24 LAB — COMPREHENSIVE METABOLIC PANEL
ALK PHOS: 39 U/L (ref 39–117)
ALT: 9 U/L (ref 0–53)
AST: 14 U/L (ref 0–37)
Albumin: 3.9 g/dL (ref 3.5–5.2)
BUN: 18 mg/dL (ref 6–23)
CO2: 29 mEq/L (ref 19–32)
Calcium: 9.4 mg/dL (ref 8.4–10.5)
Chloride: 105 mEq/L (ref 96–112)
Creatinine, Ser: 1.76 mg/dL — ABNORMAL HIGH (ref 0.40–1.50)
GFR: 47.6 mL/min — ABNORMAL LOW (ref >60.00)
GLUCOSE: 154 mg/dL — AB (ref 70–99)
POTASSIUM: 4.4 meq/L (ref 3.5–5.1)
SODIUM: 140 meq/L (ref 135–145)
TOTAL PROTEIN: 7 g/dL (ref 6.0–8.3)
Total Bilirubin: 0.5 mg/dL (ref 0.2–1.2)

## 2017-12-24 LAB — LIPID PANEL
Cholesterol: 151 mg/dL (ref 0–200)
HDL: 55 mg/dL (ref >39.00)
LDL Cholesterol: 64 mg/dL (ref 0–99)
NONHDL: 96.44
Total CHOL/HDL Ratio: 3
Triglycerides: 163 mg/dL — ABNORMAL HIGH (ref 0.0–149.0)
VLDL: 32.6 mg/dL (ref 0.0–40.0)

## 2017-12-24 LAB — HEMOGLOBIN A1C: HEMOGLOBIN A1C: 6.4 % (ref 4.6–6.5)

## 2017-12-24 LAB — VITAMIN B12: Vitamin B-12: >1500 pg/mL — ABNORMAL HIGH (ref 211–911)

## 2017-12-24 NOTE — Patient Instructions (Signed)
Vitamin B12 Deficiency Vitamin B12 deficiency means that your body is not getting enough vitamin B12. Your body needs vitamin B12 for important bodily functions. If you do not have enough vitamin B12 in your body, you can have health problems. Follow these instructions at home:  Take supplements only as told by your doctor. Follow the directions carefully.  Get any shots (injections) as told by your doctor. Do not miss your visits to the doctor.  Eat lots of healthy foods that contain vitamin B12. Ask your doctor if you should work with someone who is trained in how food affects health (dietitian). Foods that contain vitamin B12 include: ? Meat. ? Meat from birds (poultry). ? Fish. ? Eggs. ? Cereal and dairy products that are fortified. This means that vitamin B12 has been added to the food. Check the label on the package to see if the food is fortified.  Do not drink too much (do not abuse) alcohol.  Keep all follow-up visits as told by your doctor. This is important. Contact a doctor if:  Your symptoms come back. Get help right away if:  You have trouble breathing.  You have chest pain.  You get dizzy.  You pass out (lose consciousness). This information is not intended to replace advice given to you by your health care provider. Make sure you discuss any questions you have with your health care provider. Document Released: 04/24/2011 Document Revised: 10/11/2015 Document Reviewed: 09/20/2014 Elsevier Interactive Patient Education  2018 [employer]  

## 2017-12-24 NOTE — Progress Notes (Signed)
Subjective:  Patient ID: Nathan Goodwin, male    DOB: 10/16/1930  Age: 84 y.o. MRN: [id number]  CC: Follow-up   HPI Nathan Goodwin presents for follow-up of his hypertension that is been controlled with his metoprolol.  He is recently taking pioglitazone for his diabetes and will have to wait the results of the hemoglobin A1c test.  Microcytic anemia is thought to be due to vitamin B deficiency and he has been taking the B vitamin complex.  He has been taking the Megace with some good result.  His appetite is doing okay.  Accompanied by his daughter this morning.  Will be seeing his wife next  Outpatient Medications Prior to Visit  Medication Sig Dispense Refill  . alfuzosin (UROXATRAL) 10 MG 24 hr tablet TK 1 T PO QD  3  . b complex vitamins capsule Take 1 capsule by mouth daily. 100 capsule 1  . cyanocobalamin ([employer] VITAMIN B12) 2000 MCG tablet Take 1 tablet (2,000 mcg total) by mouth daily. 100 tablet 1  . megestrol (MEGACE) 20 MG tablet TK 1 T PO  BID PRN  11  . metoprolol succinate (TOPROL-XL) 50 MG 24 hr tablet Take 1 tablet (50 mg total) by mouth daily. Take with or immediately following a meal. 90 tablet 1  . pioglitazone (ACTOS) 15 MG tablet Take 1 tablet (15 mg total) by mouth daily. 90 tablet 1   No facility-administered medications prior to visit.     ROS Review of Systems  Constitutional: Negative for chills, fatigue, fever and unexpected weight change.  HENT: Negative.   Eyes: Positive for visual disturbance.  Respiratory: Negative.   Cardiovascular: Negative.   Gastrointestinal: Negative.   Endocrine: Negative for polyphagia and polyuria.  Genitourinary: Negative.   Musculoskeletal: Negative for arthralgias and joint swelling.  Skin: Negative for color change and rash.  Allergic/Immunologic: Negative for immunocompromised state.  Neurological: Negative for weakness and headaches.  Hematological: Does not bruise/bleed easily.  Psychiatric/Behavioral: Negative.      Objective:  BP 136/68   Pulse 80   Temp 98.4 F (36.9 C)   Ht 5\' 5"  (1.651 m)   Wt 159 lb 4 oz (72.2 kg)   SpO2 96%   BMI 26.50 kg/m   BP Readings from Last 3 Encounters:  12/24/17 136/68  12/09/17 130/64  09/29/17 138/78    Wt Readings from Last 3 Encounters:  12/24/17 159 lb 4 oz (72.2 kg)  09/29/17 159 lb (72.1 kg)  09/23/17 159 lb 4 oz (72.2 kg)    Physical Exam  Constitutional: He is oriented to person, place, and time. He appears well-developed and well-nourished. No distress.  HENT:  Head: Normocephalic and atraumatic.  Right Ear: External ear normal.  Left Ear: External ear normal.  Mouth/Throat: No oropharyngeal exudate.  Eyes: Conjunctivae are normal. Right eye exhibits no discharge. Left eye exhibits no discharge. No scleral icterus.  Neck: No JVD present. No tracheal deviation present.  Cardiovascular: Normal rate, regular rhythm and normal heart sounds.  Pulmonary/Chest: Effort normal and breath sounds normal. No stridor. No respiratory distress. He has no wheezes. He has no rales.  Abdominal: Bowel sounds are normal.  Musculoskeletal: He exhibits no edema.  Neurological: He is alert and oriented to person, place, and time.  Skin: Skin is warm and dry. No rash noted. He is not diaphoretic.  Psychiatric: He has a normal mood and affect. His behavior is normal.      Ct Abdomen Pelvis Wo Contrast  Result  Date: 06/08/2016 CLINICAL DATA:  History of colon cancer epigastric abdominal pain EXAM: CT ABDOMEN AND PELVIS WITHOUT CONTRAST TECHNIQUE: Multidetector CT imaging of the abdomen and pelvis was performed following the standard protocol without IV contrast. COMPARISON:  03/15/2012 FINDINGS: Lower chest: Lung bases demonstrate no acute consolidation or effusion. Normal heart size. Hepatobiliary: No focal liver abnormality is seen. No gallstones, gallbladder wall thickening, or biliary dilatation. Pancreas: Unremarkable. No pancreatic ductal dilatation or  surrounding inflammatory changes. Spleen: Normal in size without focal abnormality. Adrenals/Urinary Tract: Adrenal glands within normal limits. Nonspecific perinephric fat stranding. No hydronephrosis. No calcifications. Bladder normal Stomach/Bowel: Stomach nonenlarged. The patient is status post partial colectomy with ileocolic anastomosis in the left abdomen. There is no evidence for obstruction. No bowel wall thickening. Vascular/Lymphatic: Aortic atherosclerosis. No enlarged abdominal or pelvic lymph nodes. Reproductive: Prostate is unremarkable. Other: Small fat in the inguinal canals.  No free air or free fluid. Musculoskeletal: No acute or suspicious bone lesions IMPRESSION: 1. No CT evidence for acute intra-abdominal or pelvic pathology 2. Stable postsurgical changes. Electronically Signed   By: Donavan Foil M.D.   On: 06/08/2016 04:20   Dg Chest Port 1 View  Result Date: 06/08/2016 CLINICAL DATA:  Syncope. EXAM: PORTABLE CHEST 1 VIEW COMPARISON:  Chest radiographs 09/16/2011 FINDINGS: The cardiomediastinal contours are normal. The lungs are clear. Pulmonary vasculature is normal. No consolidation, pleural effusion, or pneumothorax. No acute osseous abnormalities are seen. IMPRESSION: No active disease. Electronically Signed   By: Jeb Levering M.D.   On: 06/08/2016 06:54    Assessment & Plan:   [name] was seen today for follow-up.  Diagnoses and all orders for this visit:  Benign essential HTN -     CBC -     Comprehensive metabolic panel  Type 2 diabetes mellitus with stage 2 chronic kidney disease, without long-term current use of insulin (HCC) -     Hemoglobin A1c -     Comprehensive metabolic panel  Macrocytic anemia  Anemia due to vitamin B12 deficiency, unspecified B12 deficiency type -     CBC  B12 deficiency -     CBC -     B12  CKD (chronic kidney disease) stage 3, GFR 30-59 ml/min (HCC) -     Comprehensive metabolic panel  Elevated LDL cholesterol level -      Lipid panel   I am having Nathan Goodwin maintain his pioglitazone, cyanocobalamin, metoprolol succinate, alfuzosin, megestrol, and b complex vitamins.  No orders of the defined types were placed in this encounter.  Follow-up will depend results of blood work.  Suggested that cyanocobalamin may be required to lift his B12 level levels and hopefully his hemoglobin as well.  Anticipatory guidance was given to him about this matter.  Follow-up: Return in about 3 months (around 03/26/2018).  Libby Maw, MD

## 2018-02-12 MED ORDER — ATORVASTATIN CALCIUM 20 MG PO TABS: 20.0000 mg | ORAL_TABLET | Freq: Every day | ORAL | 1 refills | Status: DC

## 2018-03-16 ENCOUNTER — Other Ambulatory Visit: Payer: Self-pay

## 2018-03-16 MED ORDER — GLUCOSE BLOOD VI STRP: ORAL_STRIP | 3 refills | Status: AC

## 2018-03-26 ENCOUNTER — Encounter: Payer: Self-pay | Admitting: Family Medicine

## 2018-03-26 ENCOUNTER — Ambulatory Visit: Payer: Medicare Other | Admitting: Family Medicine

## 2018-03-26 VITALS — BP 130/60 | HR 76 | Ht 65.0 in | Wt 162.0 lb

## 2018-03-26 DIAGNOSIS — E113419 Type 2 diabetes mellitus with severe nonproliferative diabetic retinopathy with macular edema, unspecified eye: Secondary | ICD-10-CM

## 2018-03-26 DIAGNOSIS — D539 Nutritional anemia, unspecified: Secondary | ICD-10-CM

## 2018-03-26 DIAGNOSIS — N183 Chronic kidney disease, stage 3 unspecified: Secondary | ICD-10-CM

## 2018-03-26 DIAGNOSIS — I10 Essential (primary) hypertension: Secondary | ICD-10-CM | POA: Diagnosis not present

## 2018-03-26 DIAGNOSIS — Z23 Encounter for immunization: Secondary | ICD-10-CM | POA: Diagnosis not present

## 2018-03-26 DIAGNOSIS — E7800 Pure hypercholesterolemia, unspecified: Secondary | ICD-10-CM

## 2018-03-26 DIAGNOSIS — N182 Chronic kidney disease, stage 2 (mild): Secondary | ICD-10-CM

## 2018-03-26 DIAGNOSIS — M1712 Unilateral primary osteoarthritis, left knee: Secondary | ICD-10-CM | POA: Diagnosis not present

## 2018-03-26 DIAGNOSIS — E1122 Type 2 diabetes mellitus with diabetic chronic kidney disease: Secondary | ICD-10-CM

## 2018-03-26 LAB — BASIC METABOLIC PANEL
BUN: 23 mg/dL (ref 6–23)
CHLORIDE: 109 meq/L (ref 96–112)
CO2: 25 mEq/L (ref 19–32)
CREATININE: 1.62 mg/dL — AB (ref 0.40–1.50)
Calcium: 8.8 mg/dL (ref 8.4–10.5)
GFR: 52.35 mL/min — ABNORMAL LOW (ref >60.00)
GLUCOSE: 121 mg/dL — AB (ref 70–99)
POTASSIUM: 4 meq/L (ref 3.5–5.1)
Sodium: 140 mEq/L (ref 135–145)

## 2018-03-26 LAB — MICROALBUMIN / CREATININE URINE RATIO
Creatinine,U: 232.6 mg/dL
Microalb Creat Ratio: 1.6 mg/g (ref 0.0–30.0)
Microalb, Ur: 3.8 mg/dL — ABNORMAL HIGH (ref 0.0–1.9)

## 2018-03-26 LAB — CBC
HEMATOCRIT: 28.4 % — AB (ref 39.0–52.0)
HEMOGLOBIN: 9.7 g/dL — AB (ref 13.0–17.0)
MCHC: 34 g/dL (ref 30.0–36.0)
MCV: 95.4 fl (ref 78.0–100.0)
Platelets: 207 10*3/uL (ref 150.0–400.0)
RBC: 2.98 Mil/uL — ABNORMAL LOW (ref 4.22–5.81)
RDW: 13.5 % (ref 11.5–15.5)
WBC: 4.4 10*3/uL (ref 4.0–10.5)

## 2018-03-26 LAB — HEMOGLOBIN A1C: HEMOGLOBIN A1C: 5.8 % (ref 4.6–6.5)

## 2018-03-26 LAB — LDL CHOLESTEROL, DIRECT: LDL DIRECT: 93 mg/dL

## 2018-03-26 MED ORDER — ATORVASTATIN CALCIUM 20 MG PO TABS: 20.0000 mg | ORAL_TABLET | Freq: Every day | ORAL | 1 refills | Status: DC

## 2018-03-26 MED ORDER — METOPROLOL SUCCINATE ER 50 MG PO TB24: 50.0000 mg | ORAL_TABLET | Freq: Every day | ORAL | 1 refills | Status: DC

## 2018-03-26 MED ORDER — PIOGLITAZONE HCL 15 MG PO TABS: 15.0000 mg | ORAL_TABLET | Freq: Every day | ORAL | 1 refills | Status: DC

## 2018-03-26 MED ORDER — DICLOFENAC SODIUM 1 % TD GEL: 2.0000 g | Freq: Two times a day (BID) | TRANSDERMAL | 1 refills | Status: DC | PRN

## 2018-03-26 NOTE — Progress Notes (Signed)
Subjective:  Patient ID: Nathan Goodwin, male    DOB: [birth year]  Age: 84 y.o. MRN: [id number]  CC: Follow-up   HPI Nathan Goodwin presents for for follow-up of his diabetes anemia chronic kidney disease and elevated ldl cholesterol.  He has been taking all of his medicines as directed and is not having any issues with them.  His left knee continues to bother him and it is responded to the Voltaren gel in the past.  He needs a refill on this.  He has no history of congestive heart failure or swelling in his legs.  Continues to take B complex or B12 deficiency.  Denies weight loss or urinary frequency.  Outpatient Medications Prior to Visit  Medication Sig Dispense Refill  . alfuzosin (UROXATRAL) 10 MG 24 hr tablet TK 1 T PO QD  3  . b complex vitamins capsule Take 1 capsule by mouth daily. 100 capsule 1  . cyanocobalamin ([employer] VITAMIN B12) 2000 MCG tablet Take 1 tablet (2,000 mcg total) by mouth daily. 100 tablet 1  . glucose blood (ONE TOUCH ULTRA TEST) test strip Use to test blood sugars 1-2 times daily. 100 each 3  . megestrol (MEGACE) 20 MG tablet TK 1 T PO  BID PRN  11  . atorvastatin (LIPITOR) 20 MG tablet Take 1 tablet (20 mg total) by mouth daily. 90 tablet 1  . metoprolol succinate (TOPROL-XL) 50 MG 24 hr tablet Take 1 tablet (50 mg total) by mouth daily. Take with or immediately following a meal. 90 tablet 1  . pioglitazone (ACTOS) 15 MG tablet Take 1 tablet (15 mg total) by mouth daily. 90 tablet 1   No facility-administered medications prior to visit.     ROS Review of Systems  Constitutional: Negative.  Negative for fatigue, fever and unexpected weight change.  HENT: Negative.   Eyes: Positive for visual disturbance.  Respiratory: Negative.   Cardiovascular: Negative.   Gastrointestinal: Negative.   Endocrine: Negative for polyphagia and polyuria.  Genitourinary: Negative.   Musculoskeletal: Positive for arthralgias and gait problem.  Skin: Negative for pallor and rash.   Allergic/Immunologic: Negative for immunocompromised state.  Neurological: Positive for tremors. Negative for dizziness and headaches.  Hematological: Does not bruise/bleed easily.  Psychiatric/Behavioral: Negative.     Objective:  BP 130/60 (BP Location: Left Arm, Patient Position: Sitting, Cuff Size: Normal)   Pulse 76   Ht 5\' 5"  (1.651 m)   Wt 162 lb (73.5 kg)   SpO2 98%   BMI 26.96 kg/m   BP Readings from Last 3 Encounters:  03/26/18 130/60  12/24/17 136/68  12/09/17 130/64    Wt Readings from Last 3 Encounters:  03/26/18 162 lb (73.5 kg)  12/24/17 159 lb 4 oz (72.2 kg)  09/29/17 159 lb (72.1 kg)    Physical Exam  Constitutional: He is oriented to person, place, and time. He appears well-developed and well-nourished. No distress.  HENT:  Head: Normocephalic and atraumatic.  Right Ear: External ear normal.  Left Ear: External ear normal.  Eyes: Right eye exhibits no discharge. Left eye exhibits no discharge. No scleral icterus.  Neck: No JVD present. No tracheal deviation present.  Cardiovascular: Normal rate, regular rhythm and normal heart sounds.  Pulmonary/Chest: Effort normal and breath sounds normal.  Musculoskeletal: He exhibits no edema.  Neurological: He is alert and oriented to person, place, and time.  Skin: Skin is warm and dry. He is not diaphoretic.  Psychiatric: He has a normal mood and affect.  His behavior is normal.      Ct Abdomen Pelvis Wo Contrast  Result Date: 06/08/2016 CLINICAL DATA:  History of colon cancer epigastric abdominal pain EXAM: CT ABDOMEN AND PELVIS WITHOUT CONTRAST TECHNIQUE: Multidetector CT imaging of the abdomen and pelvis was performed following the standard protocol without IV contrast. COMPARISON:  03/15/2012 FINDINGS: Lower chest: Lung bases demonstrate no acute consolidation or effusion. Normal heart size. Hepatobiliary: No focal liver abnormality is seen. No gallstones, gallbladder wall thickening, or biliary dilatation.  Pancreas: Unremarkable. No pancreatic ductal dilatation or surrounding inflammatory changes. Spleen: Normal in size without focal abnormality. Adrenals/Urinary Tract: Adrenal glands within normal limits. Nonspecific perinephric fat stranding. No hydronephrosis. No calcifications. Bladder normal Stomach/Bowel: Stomach nonenlarged. The patient is status post partial colectomy with ileocolic anastomosis in the left abdomen. There is no evidence for obstruction. No bowel wall thickening. Vascular/Lymphatic: Aortic atherosclerosis. No enlarged abdominal or pelvic lymph nodes. Reproductive: Prostate is unremarkable. Other: Small fat in the inguinal canals.  No free air or free fluid. Musculoskeletal: No acute or suspicious bone lesions IMPRESSION: 1. No CT evidence for acute intra-abdominal or pelvic pathology 2. Stable postsurgical changes. Electronically Signed   By: Donavan Foil M.D.   On: 06/08/2016 04:20   Dg Chest Port 1 View  Result Date: 06/08/2016 CLINICAL DATA:  Syncope. EXAM: PORTABLE CHEST 1 VIEW COMPARISON:  Chest radiographs 09/16/2011 FINDINGS: The cardiomediastinal contours are normal. The lungs are clear. Pulmonary vasculature is normal. No consolidation, pleural effusion, or pneumothorax. No acute osseous abnormalities are seen. IMPRESSION: No active disease. Electronically Signed   By: Jeb Levering M.D.   On: 06/08/2016 06:54    Assessment & Plan:   Nathan Goodwin was seen today for follow-up.  Diagnoses and all orders for this visit:  Osteoarthritis of left knee, unspecified osteoarthritis type -     diclofenac sodium (VOLTAREN) 1 % GEL; Apply 2 g topically 2 (two) times daily as needed. For knee pain  Benign essential HTN -     Basic metabolic panel -     Microalbumin / creatinine urine ratio -     metoprolol succinate (TOPROL-XL) 50 MG 24 hr tablet; Take 1 tablet (50 mg total) by mouth daily. Take with or immediately following a meal.  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  CKD (chronic kidney disease), stage III (HCC) -     Basic metabolic panel -     Microalbumin / creatinine urine ratio  Elevated LDL cholesterol level -     atorvastatin (LIPITOR) 20 MG tablet; Take 1 tablet (20 mg total) by mouth daily. -     LDL cholesterol, direct  Type 2 diabetes mellitus with stage 2 chronic kidney disease, without long-term current use of insulin (HCC) -     pioglitazone (ACTOS) 15 MG tablet; Take 1 tablet (15 mg total) by mouth daily.  Need for influenza vaccination -     Flu vaccine HIGH DOSE PF   I am having Nathan Goodwin start on diclofenac sodium  and ferrous sulfate. I am also having him maintain his cyanocobalamin, alfuzosin, megestrol, b complex vitamins, glucose blood, atorvastatin, metoprolol succinate, and pioglitazone.  Meds ordered this encounter  Medications  . atorvastatin (LIPITOR) 20 MG tablet    Sig: Take 1 tablet (20 mg total) by mouth daily.    Dispense:  90 tablet    Refill:  1  . metoprolol succinate (TOPROL-XL) 50 MG 24 hr tablet    Sig: Take 1 tablet (50 mg total) by mouth daily. Take with or immediately following a meal.    Dispense:  90 tablet    Refill:  1  . pioglitazone (ACTOS) 15 MG tablet    Sig: Take 1 tablet (15 mg total) by mouth daily.    Dispense:  90 tablet    Refill:  1  . diclofenac sodium (VOLTAREN) 1 % GEL    Sig: Apply 2 g topically 2 (two) times daily as needed. For knee pain    Dispense:  100 g    Refill:  1  . ferrous sulfate 325 (65 FE) MG tablet    Sig: Take 1 tablet (325 mg total) by mouth 2 (two) times daily with a  meal.    Dispense:  60 tablet    Refill:  3    Please fill this and ask daughter to pick it up. OV after OV they tell me that the pharmacy never gave this to them. Thank you!     Follow-up: Return in about 3 months (around 06/26/2018), or if symptoms worsen or fail to improve.  Libby Maw, MD

## 2018-03-27 LAB — IRON,TIBC AND FERRITIN PANEL
%SAT: 16 % (calc) — ABNORMAL LOW (ref 20–48)
FERRITIN: 182 ng/mL (ref 24–380)
Iron: 47 ug/dL — ABNORMAL LOW (ref 50–180)
TIBC: 295 mcg/dL (calc) (ref 250–425)

## 2018-03-29 ENCOUNTER — Encounter: Payer: Self-pay | Admitting: Family Medicine

## 2018-03-29 MED ORDER — FERROUS SULFATE 325 (65 FE) MG PO TABS: 325.0000 mg | ORAL_TABLET | Freq: Two times a day (BID) | ORAL | 3 refills | Status: DC

## 2018-06-29 ENCOUNTER — Ambulatory Visit: Payer: Medicare Other | Admitting: Family Medicine

## 2018-06-29 ENCOUNTER — Encounter: Payer: Self-pay | Admitting: Family Medicine

## 2018-06-29 VITALS — BP 110/60 | HR 64 | Ht 65.0 in | Wt 160.2 lb

## 2018-06-29 DIAGNOSIS — D539 Nutritional anemia, unspecified: Secondary | ICD-10-CM | POA: Diagnosis not present

## 2018-06-29 DIAGNOSIS — L853 Xerosis cutis: Secondary | ICD-10-CM | POA: Insufficient documentation

## 2018-06-29 DIAGNOSIS — E7800 Pure hypercholesterolemia, unspecified: Secondary | ICD-10-CM

## 2018-06-29 DIAGNOSIS — I10 Essential (primary) hypertension: Secondary | ICD-10-CM

## 2018-06-29 DIAGNOSIS — E538 Deficiency of other specified B group vitamins: Secondary | ICD-10-CM

## 2018-06-29 DIAGNOSIS — E113419 Type 2 diabetes mellitus with severe nonproliferative diabetic retinopathy with macular edema, unspecified eye: Secondary | ICD-10-CM

## 2018-06-29 DIAGNOSIS — E611 Iron deficiency: Secondary | ICD-10-CM

## 2018-06-29 LAB — VITAMIN B12

## 2018-06-29 LAB — CBC
HCT: 31 % — ABNORMAL LOW (ref 39.0–52.0)
Hemoglobin: 10.3 g/dL — ABNORMAL LOW (ref 13.0–17.0)
MCHC: 33.3 g/dL (ref 30.0–36.0)
MCV: 98 fl (ref 78.0–100.0)
Platelets: 229 10*3/uL (ref 150.0–400.0)
RBC: 3.16 Mil/uL — ABNORMAL LOW (ref 4.22–5.81)
RDW: 13.8 % (ref 11.5–15.5)
WBC: 4.7 10*3/uL (ref 4.0–10.5)

## 2018-06-29 LAB — BASIC METABOLIC PANEL
BUN: 24 mg/dL — AB (ref 6–23)
CO2: 28 mEq/L (ref 19–32)
CREATININE: 1.62 mg/dL — AB (ref 0.40–1.50)
Calcium: 9.3 mg/dL (ref 8.4–10.5)
Chloride: 104 mEq/L (ref 96–112)
GFR: 49.22 mL/min — ABNORMAL LOW (ref >60.00)
Glucose, Bld: 107 mg/dL — ABNORMAL HIGH (ref 70–99)
POTASSIUM: 4.3 meq/L (ref 3.5–5.1)
Sodium: 140 mEq/L (ref 135–145)

## 2018-06-29 LAB — HEMOGLOBIN A1C: Hgb A1c MFr Bld: 5.7 % (ref 4.6–6.5)

## 2018-06-29 LAB — LDL CHOLESTEROL, DIRECT: Direct LDL: 90 mg/dL

## 2018-06-29 MED ORDER — EUCERIN EX CREA: TOPICAL_CREAM | CUTANEOUS | 12 refills | Status: AC | PRN

## 2018-06-29 MED ORDER — TRIAMCINOLONE ACETONIDE 0.1 % EX OINT: 1.0000 "application " | TOPICAL_OINTMENT | Freq: Two times a day (BID) | CUTANEOUS | 0 refills | Status: DC

## 2018-06-29 NOTE — Progress Notes (Signed)
Established Patient Office Visit  Subjective:  Patient ID: Nathan Goodwin, male    DOB: March 08, 1931  Age: 85 y.o. MRN: [id number]  CC:  Chief Complaint  Patient presents with  . Follow-up    HPI [name] presents for follow-up of his hypertension, elevated cholesterol, low iron macrocytic anemia.  He is having issues with dry flaky skin.  He is using Dove soap in the shower and over-the-counter cortisone cream.  He is not applying a moisturizer.  He has been compliant with all of his medicines.  Past Medical History:  Diagnosis Date  . Allergy   . Arthritis   . Benign essential HTN 03/09/2012  . Chicken pox   . Colon cancer ([place])    colon ca dx 3/11  . Diabetes mellitus   . 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 Hyperlipidemia   . Hypertension   . Prostate CA [facility])    prostate ca dx 1999  . Stage II carcinoma of colon ([place]) 03/09/2012   6.5 cm  Grade II adenoca transverse colon 25 nodes negative resected 08/14/09  . Tubular adenoma of colon 03/09/2012   Right hemicolectomy 05/14/10  . 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 No pertinent surgical history.  Family History  Problem Relation Age of Onset  . Arthritis Mother   . Depression Mother   . Diabetes Mother   . Hypertension Mother   . Mental illness Mother   . Diabetes Father   . Hyperlipidemia Father   . Diabetes Brother   . Heart attack Brother   . Hyperlipidemia Brother   . Hypertension Brother   . Hyperlipidemia Daughter   . Diabetes Brother   . Heart attack Brother   . Hyperlipidemia Brother   . Hypertension Brother   . Hyperlipidemia Brother   . Hypertension Brother   . Diabetes Brother     Social History   Socioeconomic History  . Marital status: Married    Spouse name: Not on file  . Number of children: Not on file  . Years of education: Not on file  . Highest education level: Not on file  Occupational  History  . Not on file  Social Needs  . Financial resource strain: Not on file  . Food insecurity:    Worry: Not on file    Inability: Not on file  . Transportation needs:    Medical: Not on file    Non-medical: Not on file  Tobacco Use  . Smoking status: Former Research scientist (life sciences)  . Smokeless tobacco: Never Used  Substance and Sexual Activity  . Alcohol use: Not on file  . Drug use: Not on file  . Sexual activity: Not on file  Lifestyle  . Physical activity:    Days per week: Not on file    Minutes per session: Not on file  . Stress: Not on file  Relationships  . Social connections:    Talks on phone: Not on file    Gets together: Not on file    Attends religious service: Not on file    Active member of club or organization: Not on file    Attends meetings of clubs or organizations: Not on file    Relationship status: Not on file  . Intimate partner violence:    Fear of current or ex partner: Not on file    Emotionally abused: Not on file  Physically abused: Not on file    Forced sexual activity: Not on file  Other Topics Concern  . Not on file  Social History Narrative  . Not on file    Outpatient Medications Prior to Visit  Medication Sig Dispense Refill  . alfuzosin (UROXATRAL) 10 MG 24 hr tablet TK 1 T PO QD  3  . atorvastatin (LIPITOR) 20 MG tablet Take 1 tablet (20 mg total) by mouth daily. 90 tablet 1  . b complex vitamins capsule Take 1 capsule by mouth daily. 100 capsule 1  . cyanocobalamin ([employer] VITAMIN B12) 2000 MCG tablet Take 1 tablet (2,000 mcg total) by mouth daily. 100 tablet 1  . diclofenac sodium (VOLTAREN) 1 % GEL Apply 2 g topically 2 (two) times daily as needed. For knee pain 100 g 1  . ferrous sulfate 325 (65 FE) MG tablet Take 1 tablet (325 mg total) by mouth 2 (two) times daily with a meal. 60 tablet 3  . glucose blood (ONE TOUCH ULTRA TEST) test strip Use to test blood sugars 1-2 times daily. 100 each 3  . megestrol (MEGACE) 20 MG tablet TK 1 T PO  BID  PRN  11  . metoprolol succinate (TOPROL-XL) 50 MG 24 hr tablet Take 1 tablet (50 mg total) by mouth daily. Take with or immediately following a meal. 90 tablet 1  . pioglitazone (ACTOS) 15 MG tablet Take 1 tablet (15 mg total) by mouth daily. 90 tablet 1   No facility-administered medications prior to visit.     No Known Allergies  ROS Review of Systems  Constitutional: Negative for diaphoresis, fatigue, fever and unexpected weight change.  HENT: Negative.   Eyes: Positive for visual disturbance. Negative for photophobia.  Respiratory: Negative.   Cardiovascular: Negative.   Gastrointestinal: Negative.   Endocrine: Negative for polyphagia and polyuria.  Genitourinary: Negative for decreased urine volume and difficulty urinating.  Musculoskeletal: Positive for arthralgias.  Skin: Positive for rash. Negative for color change.  Allergic/Immunologic: Negative for immunocompromised state.  Neurological: Negative for light-headedness and headaches.  Hematological: Does not bruise/bleed easily.  Psychiatric/Behavioral: Negative.       Objective:    Physical Exam  Constitutional: He is oriented to person, place, and time. He appears well-developed and well-nourished. No distress.  HENT:  Head: Normocephalic and atraumatic.  Right Ear: External ear normal.  Left Ear: External ear normal.  Eyes: Right eye exhibits no discharge. Left eye exhibits no discharge. No scleral icterus.  Neck: Neck supple. No JVD present. No tracheal deviation present. No thyromegaly present.  Cardiovascular: Normal rate, regular rhythm and normal heart sounds.  Pulmonary/Chest: Effort normal and breath sounds normal.  Abdominal: Bowel sounds are normal.  Musculoskeletal:        General: No edema.  Neurological: He is alert and oriented to person, place, and time.  Skin: Skin is warm and dry. He is not diaphoretic.     Psychiatric: He has a normal mood and affect.    BP 110/60   Pulse 64   Ht 5\' 5"   (1.651 m)   Wt 160 lb 4 oz (72.7 kg)   SpO2 97%   BMI 26.67 kg/m  Wt Readings from Last 3 Encounters:  06/29/18 160 lb 4 oz (72.7 kg)  03/26/18 162 lb (73.5 kg)  12/24/17 159 lb 4 oz (72.2 kg)   BP Readings from Last 3 Encounters:  06/29/18 110/60  03/26/18 130/60  12/24/17 136/68   Guideline developer:  UpToDate (see UpToDate for  funding source) Date Released: June 2014  Health Maintenance Due  Topic Date Due  . FOOT EXAM  04/13/1943  . OPHTHALMOLOGY EXAM  04/13/1943  . TETANUS/TDAP  04/12/1952    There are no preventive care reminders to display for this patient.   Lab Results  Component Value Date   WBC 4.4 03/26/2018   HGB 9.7 (L) 03/26/2018   HCT 28.4 (L) 03/26/2018   MCV 95.4 03/26/2018   PLT 207.0 03/26/2018   Lab Results  Component Value Date   NA 140 03/26/2018   K 4.0 03/26/2018   CO2 25 03/26/2018   GLUCOSE 121 (H) 03/26/2018   BUN 23 03/26/2018   CREATININE 1.62 (H) 03/26/2018   BILITOT 0.5 12/24/2017   ALKPHOS 39 12/24/2017   AST 14 12/24/2017   ALT 9 12/24/2017   PROT 7.0 12/24/2017   ALBUMIN 3.9 12/24/2017   CALCIUM 8.8 03/26/2018   ANIONGAP 7 06/09/2016   GFR 52.35 (L) 03/26/2018   Lab Results  Component Value Date   CHOL 151 12/24/2017   Lab Results  Component Value Date   HDL 55.00 12/24/2017   Lab Results  Component Value Date   LDLCALC 64 12/24/2017   Lab Results  Component Value Date   TRIG 163.0 (H) 12/24/2017   Lab Results  Component Value Date   CHOLHDL 3 12/24/2017   Lab Results  Component Value Date   HGBA1C 5.8 03/26/2018      Assessment & Plan:   Problem List Items Addressed This Visit      Cardiovascular and Mediastinum   Benign essential HTN - Primary   Relevant Orders   Basic metabolic panel     Endocrine   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 (EUCERIN) cream     Other   Elevated LDL cholesterol level   Relevant Orders   LDL cholesterol, direct   Macrocytic anemia   Relevant Orders   Vitamin B12   CBC   B12 deficiency   Relevant Orders   Vitamin B12   Low iron   Relevant Orders   Iron, TIBC and Ferritin Panel      Meds ordered this encounter  Medications  . triamcinolone ointment (KENALOG) 0.1 %    Sig: Apply 1 application topically 2 (two) times daily. Not for face or private areas.    Dispense:  80 g    Refill:  0  . Skin Protectants, Misc. (EUCERIN) cream    Sig: Apply topically as needed for dry skin.    Dispense:  454 g    Refill:  12    Follow-up: Return in about 6 months (around 12/28/2018).   Filled out paperwork for scat.  Gust treatment of cirrhosis soaps and application of creams directly after showering.  Triamcinolone ointment for trouble some places not on the face or her private areas.

## 2018-06-30 LAB — IRON,TIBC AND FERRITIN PANEL
%SAT: 24 % (calc) (ref 20–48)
Ferritin: 218 ng/mL (ref 24–380)
Iron: 76 ug/dL (ref 50–180)
TIBC: 313 mcg/dL (calc) (ref 250–425)

## 2018-07-05 ENCOUNTER — Ambulatory Visit: Payer: Medicare Other | Admitting: Family Medicine

## 2018-07-05 ENCOUNTER — Encounter: Payer: Self-pay | Admitting: Family Medicine

## 2018-07-05 ENCOUNTER — Ambulatory Visit (INDEPENDENT_AMBULATORY_CARE_PROVIDER_SITE_OTHER): Payer: Medicare Other

## 2018-07-05 VITALS — BP 120/60 | HR 55 | Temp 98.1°F | Ht 65.0 in | Wt 158.2 lb

## 2018-07-05 DIAGNOSIS — M1712 Unilateral primary osteoarthritis, left knee: Secondary | ICD-10-CM | POA: Diagnosis not present

## 2018-07-05 NOTE — Assessment & Plan Note (Signed)
Acute on chronic worsening pain likely related to degenerative changes.  - injection today  - counseled on supportive care - consider gel injections or PT.

## 2018-07-05 NOTE — Patient Instructions (Signed)
Good to see you  Take tylenol 650 mg three times a day is the best evidence based medicine we have for arthritis.  Glucosamine sulfate 750mg twice a day is a supplement that has been shown to help moderate to severe arthritis. Vitamin D 2000 IU daily Fish oil 2 grams daily.  Tumeric 500mg twice daily.  Capsaicin topically up to four times a day may also help with pain. Please see me back in 3-4 weeks if no better.   

## 2018-07-05 NOTE — Progress Notes (Signed)
Nathan Goodwin - 85 y.o. male MRN [id number]  Date of birth: 08-01-1930  SUBJECTIVE:  Including CC & ROS.  Chief Complaint  Patient presents with  . Pain    left knee pain/ seen last in may/ constant pain, requests cortisone shot    Nathan Goodwin is a 85 y.o. male that is  Acute on chronic and worsening left knee pain. Pain is medial and throbbing. intermittent and moderate to severe. Has tried pennsaid with some improvement but doesn't last long. Has popping and no mechanical symptoms. No prior injections or surgery. Has not tried tylenol. Worse with walking.  Independent review of the left knee x-rays from 09/23/17 show degenerative changes in the medial joint space that is severe.   Review of Systems  Constitutional: Negative for fever.  HENT: Negative for congestion.   Respiratory: Negative for cough.   Cardiovascular: Negative for chest pain.  Gastrointestinal: Negative for abdominal pain.  Musculoskeletal: Positive for arthralgias.  Skin: Negative for color change.  Neurological: Negative for weakness.  Hematological: Negative for adenopathy.  Psychiatric/Behavioral: Negative for agitation.    HISTORY: Past Medical, Surgical, Social, and Family History Reviewed & Updated per EMR.   Pertinent Historical Findings include:  Past Medical History:  Diagnosis Date  . Allergy   . Arthritis   . Benign essential HTN 03/09/2012  . Chicken pox   . Colon cancer ([place])    colon ca dx 3/11  . Diabetes mellitus   . 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 Hyperlipidemia   . Hypertension   . Prostate CA [facility])    prostate ca dx 1999  . Stage II carcinoma of colon ([place]) 03/09/2012   6.5 cm  Grade II adenoca transverse colon 25 nodes negative resected 08/14/09  . Tubular adenoma of colon 03/09/2012   Right hemicolectomy 05/14/10  . 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  No Known  Allergies  Family History  Problem Relation Age of Onset  . Arthritis Mother   . Depression Mother   . Diabetes Mother   . Hypertension Mother   . Mental illness Mother   . Diabetes Father   . Hyperlipidemia Father   . Diabetes Brother   . Heart attack Brother   . Hyperlipidemia Brother   . Hypertension Brother   . Hyperlipidemia Daughter   . Diabetes Brother   . Heart attack Brother   . Hyperlipidemia Brother   . Hypertension Brother   . Hyperlipidemia Brother   . Hypertension Brother   . Diabetes Brother      Social History   Socioeconomic History  . Marital status: Married    Spouse name: Not on file  . Number of children: Not on file  . Years of education: Not on file  . Highest education level: Not on file  Occupational History  . Not on file  Social Needs  . Financial resource strain: Not on file  . Food insecurity:    Worry: Not on file    Inability: Not on file  . Transportation needs:    Medical: Not on file    Non-medical: Not on file  Tobacco Use  . Smoking status: Former Research scientist (life sciences)  . Smokeless tobacco: Never Used  Substance and Sexual Activity  . Alcohol use: Not on file  . Drug use: Not on file  . Sexual activity: Not on file  Lifestyle  .  Physical activity:    Days per week: Not on file    Minutes per session: Not on file  . Stress: Not on file  Relationships  . Social connections:    Talks on phone: Not on file    Gets together: Not on file    Attends religious service: Not on file    Active member of club or organization: Not on file    Attends meetings of clubs or organizations: Not on file    Relationship status: Not on file  . Intimate partner violence:    Fear of current or ex partner: Not on file    Emotionally abused: Not on file    Physically abused: Not on file    Forced sexual activity: Not on file  Other Topics Concern  . Not on file  Social History Narrative  . Not on file     PHYSICAL EXAM:  VS: BP 120/60   Pulse  (!) 55   Temp 98.1 F (36.7 C) (Oral)   Ht 5\' 5"  (1.651 m)   Wt 158 lb 3.2 oz (71.8 kg)   SpO2 96%   BMI 26.33 kg/m  Physical Exam Gen: NAD, alert, cooperative with exam, well-appearing ENT: normal lips, normal nasal mucosa,  Eye: normal EOM, normal conjunctiva and lids CV:  no edema, +2 pedal pulses   Resp: no accessory muscle use, non-labored,  GI: no masses or tenderness, no hernia  Skin: no rashes, no areas of induration  Neuro: normal tone, normal sensation to touch Psych:  normal insight, alert and oriented MSK:  Left knee:  No effusion  Normal strength to resistance  TTP of the medial joint line  Instability with valgus and varus stress testing  Negative McMurray's test  Neurovascularly intact    Aspiration/Injection Procedure Note ALTIN SEASE [birth year]  Procedure: Injection Indications: left knee pain   Procedure Details Consent: Risks of procedure as well as the alternatives and risks of each were explained to the (patient/caregiver).  Consent for procedure obtained. Time Out: Verified patient identification, verified procedure, site/side was marked, verified correct patient position, special equipment/implants available, medications/allergies/relevent history reviewed, required imaging and test results available.  Performed.  The area was cleaned with iodine and alcohol swabs.    The left knee superior lateral suprapatellar pouch was injected using 1 cc's of 40 mg kenalog and 4 cc's of 0.25% bupivacaine with a 22 1 1/2" needle.  Ultrasound was used. Images were obtained in long views showing the injection.     A sterile dressing was applied.  Patient did tolerate procedure well.        ASSESSMENT & PLAN:   OA (osteoarthritis) of knee Acute on chronic worsening pain likely related to degenerative changes.  - injection today  - counseled on supportive care - consider gel injections or PT.

## 2018-09-02 ENCOUNTER — Other Ambulatory Visit: Payer: Self-pay | Admitting: Family Medicine

## 2018-09-02 DIAGNOSIS — D519 Vitamin B12 deficiency anemia, unspecified: Secondary | ICD-10-CM

## 2018-09-13 ENCOUNTER — Other Ambulatory Visit: Payer: Self-pay | Admitting: Family Medicine

## 2018-09-13 DIAGNOSIS — D539 Nutritional anemia, unspecified: Secondary | ICD-10-CM

## 2018-09-14 ENCOUNTER — Other Ambulatory Visit: Payer: Self-pay | Admitting: Family Medicine

## 2018-09-14 DIAGNOSIS — E1122 Type 2 diabetes mellitus with diabetic chronic kidney disease: Secondary | ICD-10-CM

## 2018-09-14 DIAGNOSIS — N182 Chronic kidney disease, stage 2 (mild): Principal | ICD-10-CM

## 2018-10-26 ENCOUNTER — Other Ambulatory Visit: Payer: Self-pay | Admitting: Family Medicine

## 2018-10-26 DIAGNOSIS — I10 Essential (primary) hypertension: Secondary | ICD-10-CM

## 2018-12-14 ENCOUNTER — Other Ambulatory Visit: Payer: Self-pay | Admitting: Family Medicine

## 2018-12-14 DIAGNOSIS — E7800 Pure hypercholesterolemia, unspecified: Secondary | ICD-10-CM

## 2018-12-31 ENCOUNTER — Other Ambulatory Visit: Payer: Self-pay

## 2018-12-31 ENCOUNTER — Ambulatory Visit: Payer: Medicare Other | Admitting: Family Medicine

## 2018-12-31 ENCOUNTER — Encounter: Payer: Self-pay | Admitting: Family Medicine

## 2018-12-31 VITALS — BP 128/60 | HR 53 | Ht 65.0 in | Wt 154.5 lb

## 2018-12-31 DIAGNOSIS — N183 Chronic kidney disease, stage 3 unspecified: Secondary | ICD-10-CM

## 2018-12-31 DIAGNOSIS — E611 Iron deficiency: Secondary | ICD-10-CM | POA: Diagnosis not present

## 2018-12-31 DIAGNOSIS — I10 Essential (primary) hypertension: Secondary | ICD-10-CM

## 2018-12-31 DIAGNOSIS — E113419 Type 2 diabetes mellitus with severe nonproliferative diabetic retinopathy with macular edema, unspecified eye: Secondary | ICD-10-CM | POA: Diagnosis not present

## 2018-12-31 DIAGNOSIS — E7800 Pure hypercholesterolemia, unspecified: Secondary | ICD-10-CM

## 2018-12-31 DIAGNOSIS — D649 Anemia, unspecified: Secondary | ICD-10-CM | POA: Diagnosis not present

## 2018-12-31 LAB — CBC
HCT: 28.9 % — ABNORMAL LOW (ref 39.0–52.0)
Hemoglobin: 9.6 g/dL — ABNORMAL LOW (ref 13.0–17.0)
MCHC: 33.1 g/dL (ref 30.0–36.0)
MCV: 98.2 fl (ref 78.0–100.0)
Platelets: 241 10*3/uL (ref 150.0–400.0)
RBC: 2.94 Mil/uL — ABNORMAL LOW (ref 4.22–5.81)
RDW: 13.9 % (ref 11.5–15.5)
WBC: 5 10*3/uL (ref 4.0–10.5)

## 2018-12-31 LAB — BASIC METABOLIC PANEL
BUN: 23 mg/dL (ref 6–23)
CO2: 27 mEq/L (ref 19–32)
Calcium: 8.8 mg/dL (ref 8.4–10.5)
Chloride: 105 mEq/L (ref 96–112)
Creatinine, Ser: 1.67 mg/dL — ABNORMAL HIGH (ref 0.40–1.50)
GFR: 47.47 mL/min — ABNORMAL LOW (ref >60.00)
Glucose, Bld: 123 mg/dL — ABNORMAL HIGH (ref 70–99)
Potassium: 4.5 mEq/L (ref 3.5–5.1)
Sodium: 138 mEq/L (ref 135–145)

## 2018-12-31 LAB — HEMOGLOBIN A1C: Hgb A1c MFr Bld: 5.7 % (ref 4.6–6.5)

## 2018-12-31 LAB — LDL CHOLESTEROL, DIRECT: Direct LDL: 88 mg/dL

## 2018-12-31 NOTE — Progress Notes (Signed)
Established Patient Office Visit  Subjective:  Patient ID: Nathan Goodwin, male    DOB: 08/22/1930  Age: 85 y.o. MRN: [id number]  CC:  Chief Complaint  Patient presents with  . Follow-up    HPI Nathan Goodwin presents for follow-up of his hypertension, elevated cholesterol diabetes and low iron.  He has been taking his iron once or twice a day as he remembers to do it.  Continues to supplement with B12.  Having no issues with Actos.  No history of congestive heart failure.  Continues to shelter at home with his wife Florian Buff.  He is accompanied by his son today.  Past Medical History:  Diagnosis Date  . Allergy   . Arthritis   . Benign essential HTN 03/09/2012  . Chicken pox   . Colon cancer ([place])    colon ca dx 3/11  . Diabetes mellitus   . 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 Hyperlipidemia   . Hypertension   . Prostate CA [facility])    prostate ca dx 1999  . Stage II carcinoma of colon ([place]) 03/09/2012   6.5 cm  Grade II adenoca transverse colon 25 nodes negative resected 08/14/09  . Tubular adenoma of colon 03/09/2012   Right hemicolectomy 05/14/10  . 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 No pertinent surgical history.  Family History  Problem Relation Age of Onset  . Arthritis Mother   . Depression Mother   . Diabetes Mother   . Hypertension Mother   . Mental illness Mother   . Diabetes Father   . Hyperlipidemia Father   . Diabetes Brother   . Heart attack Brother   . Hyperlipidemia Brother   . Hypertension Brother   . Hyperlipidemia Daughter   . Diabetes Brother   . Heart attack Brother   . Hyperlipidemia Brother   . Hypertension Brother   . Hyperlipidemia Brother   . Hypertension Brother   . Diabetes Brother     Social History   Socioeconomic History  . Marital status: Married    Spouse name: Not on file  . Number of children: Not on file  . Years of education: Not on  file  . Highest education level: Not on file  Occupational History  . Not on file  Social Needs  . Financial resource strain: Not on file  . Food insecurity    Worry: Not on file    Inability: Not on file  . Transportation needs    Medical: Not on file    Non-medical: Not on file  Tobacco Use  . Smoking status: Former Research scientist (life sciences)  . Smokeless tobacco: Never Used  Substance and Sexual Activity  . Alcohol use: Not on file  . Drug use: Not on file  . Sexual activity: Not on file  Lifestyle  . Physical activity    Days per week: Not on file    Minutes per session: Not on file  . Stress: Not on file  Relationships  . Social Herbalist on phone: Not on file    Gets together: Not on file    Attends religious service: Not on file    Active member of club or organization: Not on file    Attends meetings of clubs or organizations: Not on file    Relationship status: Not on file  . Intimate partner violence    Fear of current or  ex partner: Not on file    Emotionally abused: Not on file    Physically abused: Not on file    Forced sexual activity: Not on file  Other Topics Concern  . Not on file  Social History Narrative  . Not on file    Outpatient Medications Prior to Visit  Medication Sig Dispense Refill  . alfuzosin (UROXATRAL) 10 MG 24 hr tablet TK 1 T PO QD  3  . atorvastatin (LIPITOR) 20 MG tablet TAKE 1 TABLET(20 MG) BY MOUTH DAILY 90 tablet 1  . b complex vitamins capsule Take 1 capsule by mouth daily. 100 capsule 1  . B-12 TR 2000 MCG TBCR TAKE 1 TABLET BY MOUTH EVERY DAY 100 tablet 1  . diclofenac sodium (VOLTAREN) 1 % GEL Apply 2 g topically 2 (two) times daily as needed. For knee pain 100 g 1  . FEROSUL 325 (65 Fe) MG tablet TAKE 1 TABLET BY MOUTH TWICE DAILY WITH A MEAL 60 tablet 3  . glucose blood (ONE TOUCH ULTRA TEST) test strip Use to test blood sugars 1-2 times daily. 100 each 3  . megestrol (MEGACE) 20 MG tablet TK 1 T PO  BID PRN  11  . metoprolol  succinate (TOPROL-XL) 50 MG 24 hr tablet TAKE 1 TABLET BY MOUTH EVERY DAY WITH OR IMMEDIATELY FOLLOWING A MEAL 90 tablet 1  . pioglitazone (ACTOS) 15 MG tablet TAKE 1 TABLET(15 MG) BY MOUTH DAILY 90 tablet 1  . Skin Protectants, Misc. (EUCERIN) cream Apply topically as needed for dry skin. 454 g 12  . triamcinolone ointment (KENALOG) 0.1 % Apply 1 application topically 2 (two) times daily. Not for face or private areas. 80 g 0   No facility-administered medications prior to visit.     No Known Allergies  ROS Review of Systems  Constitutional: Negative for chills, diaphoresis, fatigue, fever and unexpected weight change.  HENT: Negative.   Eyes: Positive for visual disturbance. Negative for photophobia.  Respiratory: Negative for cough and shortness of breath.   Cardiovascular: Negative for chest pain and palpitations.  Gastrointestinal: Negative.   Endocrine: Negative for polyphagia and polyuria.  Genitourinary: Negative.   Musculoskeletal: Negative for joint swelling and myalgias.  Skin: Negative for pallor and rash.  Allergic/Immunologic: Negative for immunocompromised state.  Neurological: Negative for light-headedness and headaches.  Hematological: Does not bruise/bleed easily.  Psychiatric/Behavioral: Negative.       Objective:    Physical Exam  Constitutional: He is oriented to person, place, and time. He appears well-developed and well-nourished. No distress.  HENT:  Head: Normocephalic and atraumatic.  Right Ear: External ear normal.  Left Ear: External ear normal.  Mouth/Throat: Oropharynx is clear and moist. No oropharyngeal exudate.  Eyes: Pupils are equal, round, and reactive to light. Conjunctivae are normal. Right eye exhibits no discharge. Left eye exhibits no discharge. No scleral icterus.  Neck: No JVD present. No tracheal deviation present.  Cardiovascular: Normal rate, regular rhythm and normal heart sounds.  Occasional extrasystoles are present.   Pulmonary/Chest: Effort normal and breath sounds normal. No stridor.  Abdominal: Bowel sounds are normal.  Neurological: He is alert and oriented to person, place, and time.  Skin: Skin is warm and dry. He is not diaphoretic.  Psychiatric: He has a normal mood and affect. His behavior is normal.    BP 128/60   Pulse (!) 53   Ht 5\' 5"  (1.651 m)   Wt 154 lb 8 oz (70.1 kg)   SpO2 96%  BMI 25.71 kg/m  Wt Readings from Last 3 Encounters:  12/31/18 154 lb 8 oz (70.1 kg)  07/05/18 158 lb 3.2 oz (71.8 kg)  06/29/18 160 lb 4 oz (72.7 kg)   BP Readings from Last 3 Encounters:  12/31/18 128/60  07/05/18 120/60  06/29/18 110/60   Guideline developer:  UpToDate (see UpToDate for funding source) Date Released: June 2014  Health Maintenance Due  Topic Date Due  . FOOT EXAM  04/13/1943  . OPHTHALMOLOGY EXAM  04/13/1943  . TETANUS/TDAP  04/12/1952  . INFLUENZA VACCINE  12/18/2018  . HEMOGLOBIN A1C  12/28/2018    There are no preventive care reminders to display for this patient.   Lab Results  Component Value Date   WBC 4.7 06/29/2018   HGB 10.3 (L) 06/29/2018   HCT 31.0 (L) 06/29/2018   MCV 98.0 06/29/2018   PLT 229.0 06/29/2018   Lab Results  Component Value Date   NA 140 06/29/2018   K 4.3 06/29/2018   CO2 28 06/29/2018   GLUCOSE 107 (H) 06/29/2018   BUN 24 (H) 06/29/2018   CREATININE 1.62 (H) 06/29/2018   BILITOT 0.5 12/24/2017   ALKPHOS 39 12/24/2017   AST 14 12/24/2017   ALT 9 12/24/2017   PROT 7.0 12/24/2017   ALBUMIN 3.9 12/24/2017   CALCIUM 9.3 06/29/2018   ANIONGAP 7 06/09/2016   GFR 49.22 (L) 06/29/2018   Lab Results  Component Value Date   CHOL 151 12/24/2017   Lab Results  Component Value Date   HDL 55.00 12/24/2017   Lab Results  Component Value Date   LDLCALC 64 12/24/2017   Lab Results  Component Value Date   TRIG 163.0 (H) 12/24/2017   Lab Results  Component Value Date   CHOLHDL 3 12/24/2017   Lab Results  Component Value Date    HGBA1C 5.7 06/29/2018      Assessment & Plan:   Problem List Items Addressed This Visit      Cardiovascular and Mediastinum   Benign essential HTN - Primary   Relevant Orders   Basic metabolic panel     Endocrine   Type 2 diabetes mellitus with severe nonproliferative retinopathy and macular edema, without long-term current use of insulin (HCC)   Relevant Orders   Basic metabolic panel   Hemoglobin A1c     Genitourinary   CKD (chronic kidney disease), stage III (HCC)   Relevant Orders   Basic metabolic panel     Other   Anemia   Relevant Orders   CBC   Low iron   Relevant Orders   Iron, TIBC and Ferritin Panel    Other Visit Diagnoses    Elevated cholesterol       Relevant Orders   LDL cholesterol, direct      No orders of the defined types were placed in this encounter.   Follow-up: Return in about 6 months (around 07/03/2019).   I believe that we can stretch Mr. Thoma follow-up to 6 months to hopefully be more on the other side of COVID.  He will follow-up sooner if needed.

## 2019-01-01 LAB — IRON,TIBC AND FERRITIN PANEL
%SAT: 26 % (calc) (ref 20–48)
Ferritin: 307 ng/mL (ref 24–380)
Iron: 68 ug/dL (ref 50–180)
TIBC: 264 mcg/dL (calc) (ref 250–425)

## 2019-01-25 ENCOUNTER — Telehealth: Payer: Self-pay | Admitting: *Deleted

## 2019-01-25 NOTE — Telephone Encounter (Signed)
Pt and his daughter Kenney Houseman given results of labs;they verbalize understanding; the pt would also like an appointment for left knee pain; he did get a cortisone shot at least 8 months ago; he would like to be seen at the same time as his wife; the pt sees Dr [name], Audrie Lia; they are transferred to [facility] for scheduling.

## 2019-01-26 ENCOUNTER — Ambulatory Visit: Payer: Self-pay

## 2019-01-26 ENCOUNTER — Encounter: Payer: Self-pay | Admitting: Family Medicine

## 2019-01-26 ENCOUNTER — Other Ambulatory Visit: Payer: Self-pay

## 2019-01-26 ENCOUNTER — Ambulatory Visit (INDEPENDENT_AMBULATORY_CARE_PROVIDER_SITE_OTHER): Payer: Medicare Other | Admitting: Family Medicine

## 2019-01-26 VITALS — BP 137/68 | Ht 65.0 in | Wt 158.0 lb

## 2019-01-26 DIAGNOSIS — R21 Rash and other nonspecific skin eruption: Secondary | ICD-10-CM

## 2019-01-26 DIAGNOSIS — M1712 Unilateral primary osteoarthritis, left knee: Secondary | ICD-10-CM

## 2019-01-26 MED ORDER — TRIAMCINOLONE ACETONIDE 40 MG/ML IJ SUSP
40.0000 mg | Freq: Once | INTRAMUSCULAR | Status: AC
  Administered 2019-01-26: 40 mg via INTRA_ARTICULAR

## 2019-01-26 MED ORDER — TRIAMCINOLONE ACETONIDE 0.1 % EX OINT: 1.0000 "application " | TOPICAL_OINTMENT | Freq: Two times a day (BID) | CUTANEOUS | 6 refills | Status: AC

## 2019-01-26 NOTE — Patient Instructions (Signed)
Good to see you Please try ice  Please try tylenol  Please rub the steroid cream on the rash. Please let me know if it doesn't get better and I may need to send in oral steroids  Please send me a message in MyChart with any questions or updates.  Please see [place] back once we get your gel injections approved.   --Dr. Raeford Razor

## 2019-01-26 NOTE — Addendum Note (Signed)
Addended by: Sherrie George F on: 01/26/2019 04:52 PM   Modules accepted: Orders

## 2019-01-26 NOTE — Progress Notes (Signed)
Nathan Goodwin - 85 y.o. male MRN [id number]  Date of birth: [birth year]  SUBJECTIVE:  Including CC & ROS.  No chief complaint on file.   Nathan Goodwin is a 85 y.o. male that is presenting with acute on chronic left knee pain.  He has known degenerative changes in the medial aspect of the knee.  He has received injections in the past that helped for a minimal amount of time.  He denies any recent injuries to the knee.  The pain can be moderate to severe.  Is becoming more constant.  Is throbbing in nature.  He denies any prior surgery.  Has had falls intermittently.    Review of Systems  Constitutional: Negative for fever.  HENT: Negative for congestion.   Respiratory: Negative for cough.   Cardiovascular: Negative for chest pain.  Gastrointestinal: Negative for abdominal pain.  Musculoskeletal: Positive for arthralgias and gait problem.  Skin: Negative for color change.  Neurological: Negative for weakness.  Hematological: Negative for adenopathy.    HISTORY: Past Medical, Surgical, Social, and Family History Reviewed & Updated per EMR.   Pertinent Historical Findings include:  Past Medical History:  Diagnosis Date  . Allergy   . Arthritis   . Benign essential HTN 03/09/2012  . Chicken pox   . Colon cancer ([place])    colon ca dx 3/11  . Diabetes mellitus   . 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 Hyperlipidemia   . Hypertension   . Prostate CA [facility])    prostate ca dx 1999  . Stage II carcinoma of colon ([place]) 03/09/2012   6.5 cm  Grade II adenoca transverse colon 25 nodes negative resected 08/14/09  . Tubular adenoma of colon 03/09/2012   Right hemicolectomy 05/14/10  . 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  No Known Allergies  Family History  Problem Relation Age of Onset  . Arthritis Mother   . Depression Mother   . Diabetes Mother   . Hypertension Mother   . Mental  illness Mother   . Diabetes Father   . Hyperlipidemia Father   . Diabetes Brother   . Heart attack Brother   . Hyperlipidemia Brother   . Hypertension Brother   . Hyperlipidemia Daughter   . Diabetes Brother   . Heart attack Brother   . Hyperlipidemia Brother   . Hypertension Brother   . Hyperlipidemia Brother   . Hypertension Brother   . Diabetes Brother      Social History   Socioeconomic History  . Marital status: Married    Spouse name: Not on file  . Number of children: Not on file  . Years of education: Not on file  . Highest education level: Not on file  Occupational History  . Not on file  Social Needs  . Financial resource strain: Not on file  . Food insecurity    Worry: Not on file    Inability: Not on file  . Transportation needs    Medical: Not on file    Non-medical: Not on file  Tobacco Use  . Smoking status: Former Research scientist (life sciences)  . Smokeless tobacco: Never Used  Substance and Sexual Activity  . Alcohol use: Not on file  . Drug use: Not on file  . Sexual activity: Not on file  Lifestyle  . Physical activity    Days per week: Not on file  Minutes per session: Not on file  . Stress: Not on file  Relationships  . Social Herbalist on phone: Not on file    Gets together: Not on file    Attends religious service: Not on file    Active member of club or organization: Not on file    Attends meetings of clubs or organizations: Not on file    Relationship status: Not on file  . Intimate partner violence    Fear of current or ex partner: Not on file    Emotionally abused: Not on file    Physically abused: Not on file    Forced sexual activity: Not on file  Other Topics Concern  . Not on file  Social History Narrative  . Not on file     PHYSICAL EXAM:  VS: There were no vitals taken for this visit. Physical Exam Gen: NAD, alert, cooperative with exam,  ENT: normal lips, normal nasal mucosa,  Eye:, no injection, CV:  no edema, +2 pedal  pulses   Resp: no accessory muscle use, non-labored,   Skin: Vesicular lesions and erythema occurring on the left lower extremity, no streaking, no areas of induration  Neuro: normal tone, normal sensation to touch Psych:  normal insight, alert and oriented MSK:  Left knee: No obvious effusion. Some valgus deformity. Instability with valgus and varus stress testing. Normal strength resistance. Negative McMurray's test. Neurovascular intact   Aspiration/Injection Procedure Note BRENNDEN THAKORE [birth year]  Procedure: Injection Indications: Left knee pain  Procedure Details Consent: Risks of procedure as well as the alternatives and risks of each were explained to the (patient/caregiver).  Consent for procedure obtained. Time Out: Verified patient identification, verified procedure, site/side was marked, verified correct patient position, special equipment/implants available, medications/allergies/relevent history reviewed, required imaging and test results available.  Performed.  The area was cleaned with iodine and alcohol swabs.    The left knee superior lateral suprapatellar pouch was injected using 1 cc's of 40 mg Kenalog and 4 cc's of 0.25% bupivacaine with a 22 1 1/2" needle.  Ultrasound was used. Images were obtained in long views showing the injection.     A sterile dressing was applied.  Patient did tolerate procedure well.       ASSESSMENT & PLAN:   Rash Seems more consistent with poison ivy.  He was working out in the yard.  He was applied heat to the area but does not appear to be a burn. -Provided triamcinolone cream. -If no improvement would consider oral steroids.  OA (osteoarthritis) of knee Acute on chronic in nature.  Has had falls but no injury to the knee. -Injection. -Counseled on home exercise therapy and supportive care. -Rollator. -We will proceed to approved for gel injections.

## 2019-01-26 NOTE — Assessment & Plan Note (Signed)
Acute on chronic in nature.  Has had falls but no injury to the knee. -Injection. -Counseled on home exercise therapy and supportive care. -Rollator. -We will proceed to approved for gel injections.

## 2019-01-26 NOTE — Assessment & Plan Note (Signed)
Seems more consistent with poison ivy.  He was working out in the yard.  He was applied heat to the area but does not appear to be a burn. -Provided triamcinolone cream. -If no improvement would consider oral steroids.

## 2019-02-03 ENCOUNTER — Other Ambulatory Visit: Payer: Self-pay

## 2019-02-03 ENCOUNTER — Ambulatory Visit: Payer: Medicare Other | Admitting: Family Medicine

## 2019-02-03 ENCOUNTER — Ambulatory Visit: Payer: Self-pay

## 2019-02-03 ENCOUNTER — Encounter: Payer: Self-pay | Admitting: Family Medicine

## 2019-02-03 VITALS — Ht 65.0 in | Wt 158.0 lb

## 2019-02-03 DIAGNOSIS — M1712 Unilateral primary osteoarthritis, left knee: Secondary | ICD-10-CM

## 2019-02-03 NOTE — Progress Notes (Signed)
Nathan Goodwin - 85 y.o. male MRN [id number]  Date of birth: Aug 31, 1930  SUBJECTIVE:  Including CC & ROS.  Chief Complaint  Patient presents with  . Knee Pain    left knee    Nathan Goodwin is a 85 y.o. male that is presenting with acute worsening of left knee pain.  He received a steroid injection and received little improvement. He has not been able to pick up his walker yet.  Pain is severe to where he has not able to sleep at night.  The pain is becoming more constant and severe.  Is localized to the knee.  No redness or significant swelling.  Worse with movement.    Review of Systems  Constitutional: Negative for fever.  HENT: Negative for congestion.   Respiratory: Negative for cough.   Cardiovascular: Negative for chest pain.  Gastrointestinal: Negative for abdominal pain.  Musculoskeletal: Positive for arthralgias and gait problem.  Skin: Negative for color change.  Neurological: Negative for weakness.  Hematological: Negative for adenopathy.    HISTORY: Past Medical, Surgical, Social, and Family History Reviewed & Updated per EMR.   Pertinent Historical Findings include:  Past Medical History:  Diagnosis Date  . Allergy   . Arthritis   . Benign essential HTN 03/09/2012  . Chicken pox   . Colon cancer ([place])    colon ca dx 3/11  . Diabetes mellitus   . 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 Hyperlipidemia   . Hypertension   . Prostate CA [facility])    prostate ca dx 1999  . Stage II carcinoma of colon ([place]) 03/09/2012   6.5 cm  Grade II adenoca transverse colon 25 nodes negative resected 08/14/09  . Tubular adenoma of colon 03/09/2012   Right hemicolectomy 05/14/10  . 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  No Known Allergies  Family History  Problem Relation Age of Onset  . Arthritis Mother   . Depression Mother   . Diabetes Mother   . Hypertension Mother   .  Mental illness Mother   . Diabetes Father   . Hyperlipidemia Father   . Diabetes Brother   . Heart attack Brother   . Hyperlipidemia Brother   . Hypertension Brother   . Hyperlipidemia Daughter   . Diabetes Brother   . Heart attack Brother   . Hyperlipidemia Brother   . Hypertension Brother   . Hyperlipidemia Brother   . Hypertension Brother   . Diabetes Brother      Social History   Socioeconomic History  . Marital status: Married    Spouse name: Not on file  . Number of children: Not on file  . Years of education: Not on file  . Highest education level: Not on file  Occupational History  . Not on file  Social Needs  . Financial resource strain: Not on file  . Food insecurity    Worry: Not on file    Inability: Not on file  . Transportation needs    Medical: Not on file    Non-medical: Not on file  Tobacco Use  . Smoking status: Former Research scientist (life sciences)  . Smokeless tobacco: Never Used  Substance and Sexual Activity  . Alcohol use: Not on file  . Drug use: Not on file  . Sexual activity: Not on file  Lifestyle  . Physical activity    Days per week: Not  on file    Minutes per session: Not on file  . Stress: Not on file  Relationships  . Social Herbalist on phone: Not on file    Gets together: Not on file    Attends religious service: Not on file    Active member of club or organization: Not on file    Attends meetings of clubs or organizations: Not on file    Relationship status: Not on file  . Intimate partner violence    Fear of current or ex partner: Not on file    Emotionally abused: Not on file    Physically abused: Not on file    Forced sexual activity: Not on file  Other Topics Concern  . Not on file  Social History Narrative  . Not on file     PHYSICAL EXAM:  VS: Ht 5\' 5"  (1.651 m)   Wt 158 lb (71.7 kg)   BMI 26.29 kg/m  Physical Exam Gen: NAD, alert, cooperative with exam, well-appearing ENT: normal lips, normal nasal mucosa,  Eye:  normal EOM, normal conjunctiva and lids CV:  no edema, +2 pedal pulses   Resp: no accessory muscle use, non-labored,  Skin: no rashes, no areas of induration  Neuro: normal tone, normal sensation to touch Psych:  normal insight, alert and oriented MSK:  Left knee: Tenderness to palpation of the medial joint line. Normal range of motion. Instability with valgus and varus stress testing. Negative Murray's test. Neurovascular intact   Aspiration/Injection Procedure Note Nathan Goodwin [birth year]  Procedure: Injection Indications: left knee pain   Procedure Details Consent: Risks of procedure as well as the alternatives and risks of each were explained to the (patient/caregiver).  Consent for procedure obtained. Time Out: Verified patient identification, verified procedure, site/side was marked, verified correct patient position, special equipment/implants available, medications/allergies/relevent history reviewed, required imaging and test results available.  Performed.  The area was cleaned with iodine and alcohol swabs.    The left knee superior lateral suprapatellar pouch was injected using 4 cc's of 1% lidocaine with a 2 1 2" needle.  The syringe was switched and a 60 mg/3 mL of Durolane was injected. Ultrasound was used. Images were obtained in  Long views showing the injection.    A sterile dressing was applied.  Patient did tolerate procedure well.      ASSESSMENT & PLAN:   OA (osteoarthritis) of knee Worsening left knee pain with little improvement with CSI.  - Durolane today  -We will proceed with medial unloader brace due to thigh to calf ratio. -Discussed surgery but this would be risky given his other comorbidities. -Follow-up in 4 weeks.

## 2019-02-03 NOTE — Patient Instructions (Signed)
Good to see you  Please try tylenol  Please try ice  Please send me a message in MyChart with any questions or updates.  Please see me back in 4 weeks.   --Dr. Raeford Razor

## 2019-02-03 NOTE — Assessment & Plan Note (Signed)
Worsening left knee pain with little improvement with CSI.  - Durolane today  -We will proceed with medial unloader brace due to thigh to calf ratio. -Discussed surgery but this would be risky given his other comorbidities. -Follow-up in 4 weeks.

## 2019-02-07 ENCOUNTER — Encounter (HOSPITAL_COMMUNITY): Payer: Self-pay | Admitting: Emergency Medicine

## 2019-02-07 ENCOUNTER — Emergency Department (HOSPITAL_COMMUNITY): Payer: Medicare Other

## 2019-02-07 ENCOUNTER — Emergency Department (HOSPITAL_COMMUNITY)
Admission: EM | Admit: 2019-02-07 | Discharge: 2019-02-07 | Disposition: A | Discharge status: Home / Self Care | Payer: Medicare Other | Attending: Emergency Medicine | Admitting: Emergency Medicine

## 2019-02-07 ENCOUNTER — Other Ambulatory Visit: Payer: Self-pay

## 2019-02-07 DIAGNOSIS — Y999 Unspecified external cause status: Secondary | ICD-10-CM | POA: Diagnosis not present

## 2019-02-07 DIAGNOSIS — Y939 Activity, unspecified: Secondary | ICD-10-CM | POA: Diagnosis not present

## 2019-02-07 DIAGNOSIS — R29818 Other symptoms and signs involving the nervous system: Secondary | ICD-10-CM | POA: Insufficient documentation

## 2019-02-07 DIAGNOSIS — Z79899 Other long term (current) drug therapy: Secondary | ICD-10-CM | POA: Insufficient documentation

## 2019-02-07 DIAGNOSIS — W010XXA Fall on same level from slipping, tripping and stumbling without subsequent striking against object, initial encounter: Secondary | ICD-10-CM | POA: Diagnosis not present

## 2019-02-07 DIAGNOSIS — I129 Hypertensive chronic kidney disease with stage 1 through stage 4 chronic kidney disease, or unspecified chronic kidney disease: Secondary | ICD-10-CM | POA: Diagnosis not present

## 2019-02-07 DIAGNOSIS — N182 Chronic kidney disease, stage 2 (mild): Secondary | ICD-10-CM | POA: Insufficient documentation

## 2019-02-07 DIAGNOSIS — M25562 Pain in left knee: Secondary | ICD-10-CM

## 2019-02-07 DIAGNOSIS — M545 Low back pain, unspecified: Secondary | ICD-10-CM

## 2019-02-07 DIAGNOSIS — Z87891 Personal history of nicotine dependence: Secondary | ICD-10-CM | POA: Insufficient documentation

## 2019-02-07 DIAGNOSIS — Y92009 Unspecified place in unspecified non-institutional (private) residence as the place of occurrence of the external cause: Secondary | ICD-10-CM | POA: Diagnosis not present

## 2019-02-07 DIAGNOSIS — E1122 Type 2 diabetes mellitus with diabetic chronic kidney disease: Secondary | ICD-10-CM | POA: Diagnosis not present

## 2019-02-07 DIAGNOSIS — R296 Repeated falls: Secondary | ICD-10-CM

## 2019-02-07 LAB — BASIC METABOLIC PANEL
Anion gap: 7 (ref 5–15)
BUN: 18 mg/dL (ref 8–23)
CO2: 26 mmol/L (ref 22–32)
Calcium: 9.3 mg/dL (ref 8.9–10.3)
Chloride: 101 mmol/L (ref 98–111)
Creatinine, Ser: 1.59 mg/dL — ABNORMAL HIGH (ref 0.61–1.24)
GFR calc Af Amer: 45 mL/min — ABNORMAL LOW (ref >60)
GFR calc non Af Amer: 39 mL/min — ABNORMAL LOW (ref >60)
Glucose, Bld: 118 mg/dL — ABNORMAL HIGH (ref 70–99)
Potassium: 4.5 mmol/L (ref 3.5–5.1)
Sodium: 134 mmol/L — ABNORMAL LOW (ref 135–145)

## 2019-02-07 LAB — URINALYSIS, ROUTINE W REFLEX MICROSCOPIC
Bacteria, UA: NONE SEEN
Bilirubin Urine: NEGATIVE
Glucose, UA: 150 mg/dL — AB
Ketones, ur: NEGATIVE mg/dL
Leukocytes,Ua: NEGATIVE
Nitrite: NEGATIVE
Protein, ur: NEGATIVE mg/dL
Specific Gravity, Urine: 1.003 — ABNORMAL LOW (ref 1.005–1.030)
pH: 6 (ref 5.0–8.0)

## 2019-02-07 LAB — CBC WITH DIFFERENTIAL/PLATELET
Abs Immature Granulocytes: 0.03 10*3/uL (ref 0.00–0.07)
Basophils Absolute: 0 10*3/uL (ref 0.0–0.1)
Basophils Relative: 0 %
Eosinophils Absolute: 0 10*3/uL (ref 0.0–0.5)
Eosinophils Relative: 0 %
HCT: 33.5 % — ABNORMAL LOW (ref 39.0–52.0)
Hemoglobin: 10.8 g/dL — ABNORMAL LOW (ref 13.0–17.0)
Immature Granulocytes: 0 %
Lymphocytes Relative: 21 %
Lymphs Abs: 1.8 10*3/uL (ref 0.7–4.0)
MCH: 31.4 pg (ref 26.0–34.0)
MCHC: 32.2 g/dL (ref 30.0–36.0)
MCV: 97.4 fL (ref 80.0–100.0)
Monocytes Absolute: 0.8 10*3/uL (ref 0.1–1.0)
Monocytes Relative: 10 %
Neutro Abs: 5.6 10*3/uL (ref 1.7–7.7)
Neutrophils Relative %: 69 %
Platelets: 249 10*3/uL (ref 150–400)
RBC: 3.44 MIL/uL — ABNORMAL LOW (ref 4.22–5.81)
RDW: 12.8 % (ref 11.5–15.5)
WBC: 8.3 10*3/uL (ref 4.0–10.5)
nRBC: 0 % (ref 0.0–0.2)

## 2019-02-07 NOTE — ED Notes (Signed)
Patient verbalizes understanding of discharge instructions. Opportunity for questioning and answers were provided. Armband removed by staff, pt discharged from ED via wheelchair to home.  

## 2019-02-07 NOTE — ED Provider Notes (Signed)
8:36 PM signout from [employer] at shift change.  Patient with ongoing knee pain, worsening more frequent falls lately.  Lab work, UA, MRI of the lumbar spine, CT of the head pending.  X-ray of the knee shows arthritis.  Imaging does not show any acute or emergent findings.  Patient and daughter updated at bedside, I spoke with the other daughter by telephone.  I have placed home health face-to-face referral in epic.  Patient's family member has procured a walker that we will be waiting for him upon return home tonight.  Patient encouraged to use this.  Encouraged follow-up with his orthopedist and with primary care.  I let them know that they should be contacted regarding home health as this may be beneficial to the patient given his recent mobility problems and falls.  Results for orders placed or performed during the hospital encounter of [id number]  Basic metabolic panel  Result Value Ref Range   Sodium 134 (L) 135 - 145 mmol/L   Potassium 4.5 3.5 - 5.1 mmol/L   Chloride 101 98 - 111 mmol/L   CO2 26 22 - 32 mmol/L   Glucose, Bld 118 (H) 70 - 99 mg/dL   BUN 18 8 - 23 mg/dL   Creatinine, Ser 1.59 (H) 0.61 - 1.24 mg/dL   Calcium 9.3 8.9 - 10.3 mg/dL   GFR calc non Af Amer 39 (L) >60 mL/min   GFR calc Af Amer 45 (L) >60 mL/min   Anion gap 7 5 - 15  CBC with Differential  Result Value Ref Range   WBC 8.3 4.0 - 10.5 K/uL   RBC 3.44 (L) 4.22 - 5.81 MIL/uL   Hemoglobin 10.8 (L) 13.0 - 17.0 g/dL   HCT 33.5 (L) 39.0 - 52.0 %   MCV 97.4 80.0 - 100.0 fL   MCH 31.4 26.0 - 34.0 pg   MCHC 32.2 30.0 - 36.0 g/dL   RDW 12.8 11.5 - 15.5 %   Platelets 249 150 - 400 K/uL   nRBC 0.0 0.0 - 0.2 %   Neutrophils Relative % 69 %   Neutro Abs 5.6 1.7 - 7.7 K/uL   Lymphocytes Relative 21 %   Lymphs Abs 1.8 0.7 - 4.0 K/uL   Monocytes Relative 10 %   Monocytes Absolute 0.8 0.1 - 1.0 K/uL   Eosinophils Relative 0 %   Eosinophils Absolute 0.0 0.0 - 0.5 K/uL   Basophils Relative 0 %   Basophils Absolute  0.0 0.0 - 0.1 K/uL   Immature Granulocytes 0 %   Abs Immature Granulocytes 0.03 0.00 - 0.07 K/uL  Urinalysis, Routine w reflex microscopic  Result Value Ref Range   Color, Urine COLORLESS (A) YELLOW   APPearance CLEAR CLEAR   Specific Gravity, Urine 1.003 (L) 1.005 - 1.030   pH 6.0 5.0 - 8.0   Glucose, UA 150 (A) NEGATIVE mg/dL   Hgb urine dipstick SMALL (A) NEGATIVE   Bilirubin Urine NEGATIVE NEGATIVE   Ketones, ur NEGATIVE NEGATIVE mg/dL   Protein, ur NEGATIVE NEGATIVE mg/dL   Nitrite NEGATIVE NEGATIVE   Leukocytes,Ua NEGATIVE NEGATIVE   RBC / HPF 0-5 0 - 5 RBC/hpf   Bacteria, UA NONE SEEN NONE SEEN   Dg Chest 2 View  Result Date: 02/07/2019 CLINICAL DATA:  Fall, back pain. EXAM: CHEST - 2 VIEW COMPARISON:  06/08/2016 09/16/2011. FINDINGS: Mediastinum and hilar structures normal. Lungs are clear. No pleural effusion or pneumothorax. Heart size normal. Minimal compressions of multiple thoracic vertebral bodies, these may be  chronic. These appear progressed from prior study of 09/16/2011. Degenerative change thoracic spine. IMPRESSION: 1.  No acute cardiopulmonary disease. 2. Minimal compressions of multiple thoracic vertebral bodies, these may be chronic. These appear progressed from prior study of 09/16/2011. Electronically Signed   By: Marcello Moores  Register   On: 02/07/2019 15:30   Ct Head Wo Contrast  Result Date: 02/07/2019 CLINICAL DATA:  Left lower extremity weakness after fall. History of colon carcinoma EXAM: CT HEAD WITHOUT CONTRAST TECHNIQUE: Contiguous axial images were obtained from the base of the skull through the vertex without intravenous contrast. COMPARISON:  None. FINDINGS: Brain: There is mild diffuse atrophy. There is no intracranial mass, hemorrhage, extra-axial fluid collection, or midline shift. There is mild small vessel disease in the centra semiovale bilaterally. There is evidence of a prior small lacunar infarct in the superior most aspect of the head of the caudate  nucleus on the right. No acute infarct evident. Vascular: No hyperdense vessel. There is calcification in each carotid siphon region. Skull: Bony calvarium appears intact. Sinuses/Orbits: There is mucosal thickening in each maxillary antrum. There is mucosal thickening in several ethmoid air cells. Orbits appear symmetric bilaterally. Other: There is opacification in each inferior mastoid region. Postoperative changes noted in the mastoid region on the right. IMPRESSION: Mild atrophy with mild periventricular small vessel disease. Prior tiny lacunar infarct in the superior aspect of the head of the caudate nucleus on the right. No acute infarct. No mass or hemorrhage. There are foci of arterial vascular calcification. Mucosal thickening in several paranasal sinuses. Opacification in each inferior mastoid region. Electronically Signed   By: Lowella Grip III M.D.   On: 02/07/2019 19:29   Mr Lumbar Spine Wo Contrast  Result Date: 02/07/2019 CLINICAL DATA:  Midline low back pain with left leg weakness and increasing falls. EXAM: MRI LUMBAR SPINE WITHOUT CONTRAST TECHNIQUE: Multiplanar, multisequence MR imaging of the lumbar spine was performed. No intravenous contrast was administered. COMPARISON:  CT abdomen and pelvis 06/08/2016 FINDINGS: Segmentation:  Standard. Alignment:  Trace anterolisthesis of L5 on S1. Vertebrae: No fracture, suspicious osseous lesion, or significant marrow edema. Conus medullaris and cauda equina: Conus extends to the superior L1 level. Conus and cauda equina appear normal. Paraspinal and other soft tissues: Partially visualized bilateral extrarenal pelves and small right renal cysts measuring up to 1.2 cm. Disc levels: Multilevel disc desiccation greatest at L3-4 where there is mild disc space narrowing. L1-2: Negative. L2-3: Mild disc bulging, a small left subarticular disc protrusion, and mild facet and ligamentum flavum hypertrophy result in mild left lateral recess stenosis. No  spinal or neural foraminal stenosis. L3-4: Circumferential disc bulging and mild facet and ligamentum flavum hypertrophy result in moderate bilateral lateral recess stenosis, mild generalized spinal stenosis, and mild right and mild-to-moderate left neural foraminal stenosis. The L4 nerve roots could be affected in the lateral recesses. L4-5: Circumferential disc bulging, a small superimposed right subarticular and right foraminal disc protrusion, and mild facet and moderate ligamentum flavum hypertrophy result in mild spinal stenosis, moderate to severe bilateral lateral recess stenosis, and mild bilateral neural foraminal stenosis with potential bilateral L5 nerve root impingement. L5-S1: Anterolisthesis with bulging uncovered disc and moderate right and severe left facet hypertrophy result in mild bilateral lateral recess and minimal bilateral neural foraminal stenosis. IMPRESSION: Multilevel lumbar disc and facet degeneration with bilateral lateral recess stenosis at L4-5 greater than L3-4. Electronically Signed   By: Logan Bores M.D.   On: 02/07/2019 17:51   Dg Knee Complete  4 Views Left  Result Date: 02/07/2019 CLINICAL DATA:  Left knee pain after multiple falls EXAM: LEFT KNEE - COMPLETE 4+ VIEW COMPARISON:  09/23/2017 FINDINGS: No evidence of fracture, dislocation, or joint effusion. Degenerative spurring greatest at the medial compartment. Extensive atherosclerotic calcification. IMPRESSION: No acute finding. Osteoarthritis with medial compartment narrowing. Electronically Signed   By: Monte Fantasia M.D.   On: 02/07/2019 11:23      Carlisle Cater, PA-C 02/07/19 [id number]    Veryl Speak, MD 02/07/19 [id number]

## 2019-02-07 NOTE — ED Triage Notes (Signed)
Pt reports multiple recent falls over the past few days, states he last fell this morning and is c/o L knee pain. He reports he has been getting steroid shots in his L knee as well.

## 2019-02-07 NOTE — ED Notes (Signed)
Patient transported to CT 

## 2019-02-07 NOTE — Discharge Instructions (Signed)
Please read and follow all provided instructions.  Your diagnoses today include:  1. Multiple falls   2. Acute pain of left knee   3. Acute midline low back pain, unspecified whether sciatica present     Tests performed today include:  Blood counts and electrolytes  Urine test  CT of the head - shows an old lacunar stroke, not causing any of your current symptoms  MRI of your lower back - shows some disc bulges and arthritis in the lower spine  Knee x-ray - shows arthritis, no broken bones  Vital signs. See below for your results today.   Medications prescribed:   None  Take any prescribed medications only as directed.  Home care instructions:  Follow any educational materials contained in this packet.  I have placed home health consults on your behalf.  You should receive some sort of follow-up regarding this in the next couple of days.  Follow-up instructions: Please follow-up with your primary care provider in the next 3 days for further evaluation of your symptoms.   Return instructions:   Please return to the Emergency Department if you experience worsening symptoms.   Please return if you have any other emergent concerns.  Additional Information:  Your vital signs today were: BP (!) 157/67 (BP Location: Left Arm)    Pulse 66    Temp 98.4 F (36.9 C) (Oral)    Resp 18    SpO2 100%  If your blood pressure (BP) was elevated above 135/85 this visit, please have this repeated by your doctor within one month. --------------

## 2019-02-07 NOTE — ED Provider Notes (Signed)
[facility] EMERGENCY DEPARTMENT Provider Note   CSN: [id number] Arrival date & time: 02/07/19  1021     History   Chief Complaint Chief Complaint  Patient presents with   Fall   Knee Pain    HPI MELVIN ALM is a 85 y.o. male.     PATRIOT SANE is a 85 y.o. male with hx of hypertension, diabetes, hyperlipidemia, colon cancer s/p resection, prostate cancer, and osteoarthritis, who presents to the ED accompanied by his daughter for evaluation of multiple falls.  Patient's daughter reports that he lives at home but she and her sister, trade-off helping to take care of him.  They report that at baseline he is usually very mobile and does a lot of walking with a cane but over the past week he has had increasing falls and today he fell 4 times just this morning.  Most times he fell on his bottom, 1 time he will landed on his left knee.  Patient reports he already has a lot of problems with his left knee due to osteoarthritis, has been following with Dr. Raeford Razor with sports medicine, and has received recent cortisone and Synvisc injections.  They deny any redness or warmth the knee, slight swelling.  They have not noted any fevers.  Patient also reports that he intermittently has problems with back pain and the daughter reports that he frequently complains of pain throughout his left leg over the last few weeks.  Family does not think that patient has hit his head with these falls and he denies headache or vision changes.  He does report that his left leg feels a bit weak and he thinks that he is falling because his leg keeps giving out on him.  He denies any numbness or tingling.  He denies any fevers, no chest pain or shortness of breath, no cough.  He has not had any abdominal pain, nausea, vomiting or diarrhea.  He does report some frequent urination, denies dysuria.  At baseline he typically walks with a cane, has not used a walker.     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 ([place]) 03/09/2012    Patient Active Problem List   Diagnosis Date Noted   Rash 01/26/2019   Low iron 06/29/2018   Xerosis cutis 06/29/2018   CKD (chronic kidney disease), stage III ([place]) 12/24/2017   Allergic reaction 12/09/2017   B12 deficiency 12/09/2017   OA (osteoarthritis) of knee 09/23/2017   Anemia 06/26/2017   Blind 06/12/2017   Elevated LDL cholesterol level 06/12/2017   Syncope 06/08/2016   Stage II carcinoma of colon ([place]) 03/09/2012   Tubular adenoma of colon 03/09/2012   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 No pertinent surgical history.      Home Medications    Prior to Admission medications   Medication Sig Start Date End Date Taking? Authorizing Provider  alfuzosin (UROXATRAL) 10 MG 24 hr tablet TK 1 T PO QD 09/17/17   [provider]  atorvastatin (LIPITOR) 20 MG tablet TAKE 1 TABLET(20 MG) BY MOUTH DAILY 12/14/18   Libby Maw, MD  b complex vitamins capsule Take 1 capsule by mouth daily. 12/09/17   Libby Maw, MD  B-12 TR 2000 MCG TBCR TAKE 1 TABLET BY MOUTH EVERY DAY 09/02/18    Libby Maw, MD  diclofenac sodium (VOLTAREN) 1 % GEL Apply 2 g topically 2 (two) times daily as needed. For knee pain 03/26/18   Libby Maw, MD  FEROSUL 325 (65 Fe) MG tablet TAKE 1 TABLET BY MOUTH TWICE DAILY WITH A MEAL 09/13/18   Libby Maw, MD  glucose blood (ONE TOUCH ULTRA TEST) test strip Use to test blood sugars 1-2 times daily. 03/16/18   Libby Maw, MD  megestrol (MEGACE) 20 MG tablet TK 1 T PO  BID PRN 09/23/17   [provider]  metoprolol succinate (TOPROL-XL) 50 MG 24 hr tablet TAKE 1 TABLET BY MOUTH EVERY DAY WITH OR IMMEDIATELY FOLLOWING A MEAL 10/26/18   Libby Maw, MD  pioglitazone (ACTOS) 15 MG tablet TAKE 1 TABLET(15 MG) BY MOUTH DAILY 09/14/18   Libby Maw, MD  Skin Protectants, Misc. (EUCERIN) cream Apply topically as needed for dry skin. 06/29/18   Libby Maw, MD  triamcinolone ointment (KENALOG) 0.1 % Apply 1 application topically 2 (two) times daily. To affected areas 01/26/19   Rosemarie Ax, MD    Family History Family History  Problem Relation Age of Onset   Arthritis Mother    Depression Mother    Diabetes Mother    Hypertension Mother    Mental illness Mother    Diabetes Father    Hyperlipidemia Father    Diabetes Brother    Heart attack Brother    Hyperlipidemia Brother    Hypertension Brother    Hyperlipidemia Daughter    Diabetes Brother    Heart attack Brother    Hyperlipidemia Brother    Hypertension Brother    Hyperlipidemia Brother    Hypertension Brother    Diabetes Brother     Social History Social History   Tobacco Use   Smoking status: Former Smoker   Smokeless tobacco: Never Used  Substance Use Topics   Alcohol use: Not on file   Drug use: Not on file     Allergies   Patient has no known allergies.   Review of Systems Review of Systems  Constitutional: Negative for chills and fever.  HENT: Negative for congestion.    Eyes: Negative for visual disturbance.  Respiratory: Negative for cough and shortness of breath.   Cardiovascular: Negative for chest pain and leg swelling.  Gastrointestinal: Negative for abdominal pain, constipation, diarrhea, nausea and vomiting.  Genitourinary: Positive for frequency. Negative for dysuria, flank pain and hematuria.  Musculoskeletal: Positive for arthralgias and back pain. Negative for myalgias and neck pain.  Skin: Negative for color change and rash.  Neurological: Positive for weakness. Negative for dizziness, syncope, light-headedness and numbness.     Physical Exam Updated Vital Signs BP (!) 132/50 (BP  Location: Right Arm)    Pulse (!) 57    Temp 98.4 F (36.9 C) (Oral)    Resp 16    SpO2 99%   Physical Exam Vitals signs and nursing note reviewed.  Constitutional:      General: He is not in acute distress.    Appearance: Normal appearance. He is well-developed and normal weight. He is not ill-appearing or diaphoretic.     Comments: Elderly gentleman laying in bed in no acute distress, awake and alert, responds appropriately to questions  HENT:     Head: Normocephalic and atraumatic.     Comments: No palpable hematoma or deformity over the scalp, negative raccoon and battle sign    Mouth/Throat:     Mouth: Mucous membranes are moist.     Pharynx: Oropharynx is clear.  Eyes:     General:        Right eye: No discharge.        Left eye: No discharge.     Pupils: Pupils are equal, round, and reactive to light.  Neck:     Musculoskeletal: Neck supple.  Cardiovascular:     Rate and Rhythm: Normal rate and regular rhythm.     Heart sounds: Normal heart sounds. No murmur. No friction rub. No gallop.   Pulmonary:     Effort: Pulmonary effort is normal. No respiratory distress.     Breath sounds: Normal breath sounds. No wheezing or rales.     Comments: Respirations equal and unlabored, patient able to speak in full sentences, lungs clear to auscultation  bilaterally Abdominal:     General: Bowel sounds are normal. There is no distension.     Palpations: Abdomen is soft. There is no mass.     Tenderness: There is no abdominal tenderness. There is no guarding.     Comments: Abdomen soft, nondistended, nontender to palpation in all quadrants without guarding or peritoneal signs  Musculoskeletal:     Comments: Tenderness to palpation over the midline lower lumbar spine without overlying skin changes or palpable deformity.  No thoracic spine tenderness. There is also some tenderness over the left knee, noted previous injection site from a few days ago, no generalized erythema or warmth.  Slight anterior effusion palpable, able to flex and extend completely with minimal pain. Bilateral lower extremities warm and well perfused with 2+ DP and PT pulses  Skin:    General: Skin is warm and dry.     Capillary Refill: Capillary refill takes less than 2 seconds.  Neurological:     Mental Status: He is alert.     Coordination: Coordination normal.     Comments: Speech is clear, able to follow commands CN III-XII intact Normal strength in upper extremities bilaterally including strong and equal grip strength, bilateral lower extremities able to lift legs off the bed without difficulty but in the left leg patient does give to some resistance. Sensation normal to light and sharp touch in bilateral upper and lower extremities.   Moves extremities without ataxia, coordination intact   Psychiatric:        Mood and Affect: Mood normal.        Behavior: Behavior normal.      ED Treatments / Results  Labs (all labs ordered are listed, but only abnormal results are displayed) Labs Reviewed  BASIC METABOLIC PANEL - Abnormal; Notable for the following components:      Result Value   Sodium 134 (*)    Glucose, Bld 118 (*)  Creatinine, Ser 1.59 (*)    GFR calc non Af Amer 39 (*)    GFR calc Af Amer 45 (*)    All other components within normal limits    CBC WITH DIFFERENTIAL/PLATELET - Abnormal; Notable for the following components:   RBC 3.44 (*)    Hemoglobin 10.8 (*)    HCT 33.5 (*)    All other components within normal limits  URINALYSIS, ROUTINE W REFLEX MICROSCOPIC    EKG EKG Interpretation  Date/Time:  Monday February 07 2019 14:25:26 EDT Ventricular Rate:  57 PR Interval:    QRS Duration: 89 QT Interval:  425 QTC Calculation: 414 R Axis:   45 Text Interpretation:  Sinus rhythm Atrial premature complex Abnormal R-wave progression, early transition similar to older tracings Confirmed by Charlesetta Shanks [phone]) on 02/07/2019 2:58:04 PM   Radiology Dg Knee Complete 4 Views Left  Result Date: 02/07/2019 CLINICAL DATA:  Left knee pain after multiple falls EXAM: LEFT KNEE - COMPLETE 4+ VIEW COMPARISON:  09/23/2017 FINDINGS: No evidence of fracture, dislocation, or joint effusion. Degenerative spurring greatest at the medial compartment. Extensive atherosclerotic calcification. IMPRESSION: No acute finding. Osteoarthritis with medial compartment narrowing. Electronically Signed   By: Monte Fantasia M.D.   On: 02/07/2019 11:23    Procedures Procedures (including critical care time)  Medications Ordered in ED Medications - No data to display   Initial Impression / Assessment and Plan / ED Course  I have reviewed the triage vital signs and the nursing notes.  Pertinent labs & imaging results that were available during my care of the patient were reviewed by me and considered in my medical decision making (see chart for details).  85 year old male presenting with multiple falls over the past week, especially today.  Reports feeling like his left leg gives out.  Daughter reports that he is intermittently been complaining of pain throughout the left leg especially in the knee, as well as some midline low back pain over the past few weeks.  Has been getting injections in the left knee with minimal improvement in his symptoms.   They do not think he hit his head when he fell today.  Denies fevers, chest pain, shortness of breath.  Patient denies feeling lightheaded or any syncopal episodes.  He does report some urinary frequency without dysuria.  Denies abdominal pain.  Knee x-ray obtained from triage which shows no evident fracture, dislocation or effusion, extensive osteoarthritis noted.  Will check basic labs and urinalysis as well as EKG to look for any potential metabolic or infectious cause for increased falls.  We will also check CT head, chest x-ray.  Given persistent falls and reported pain throughout the left leg with some slight weakness noted in the left leg on exam question if there could potentially be spinal stenosis contributing to patient's falls.  Will get MRI of the lumbar spine.  Discussed this plan with patient and daughter and they are in agreement.  EKG shows sinus rhythm with some PACs, similar to previous EKGs.  Lab work reassuring, creatinine is at baseline no other significant electrolyte derangements, hemoglobin improved from previous at 10.8 and no leukocytosis.  Awaiting urinalysis and further imaging.  At shift change care signed out to [facility] who will follow-up on additional imaging studies, if imaging does not show any acute change feel that patient can be discharged home, I do think that he would benefit from using a walker rather than a cane for increased stability.  And the  patient should continue to follow-up with PCP and sports medicine for arthritis treatment.  Patient discussed with Dr. Johnney Killian, who saw patient as well and agrees with plan.  Final Clinical Impressions(s) / ED Diagnoses   Final diagnoses:  Multiple falls  Acute pain of left knee  Acute midline low back pain, unspecified whether sciatica present    ED Discharge Orders    None       Janet Berlin 02/07/19 1531    Charlesetta Shanks, MD 02/12/19 1017

## 2019-02-07 NOTE — ED Notes (Addendum)
ED Provider at bedside speaking with family at bedside and on the phone as well.

## 2019-02-07 NOTE — ED Provider Notes (Signed)
Medical screening examination/treatment/procedure(s) were conducted as a shared visit with non-physician practitioner(s) and myself.  I personally evaluated the patient during the encounter.    Patient has been having increasing falls over the past several days.  He feels like the left leg is giving out.  He reports he is had problems with the left knee and gotten steroid shots several times.  He reports they just do not seem to help anymore.  The leg will give out and caused him to fall he actually fell onto the left knee as well which is painful.  He reports he does have problems with lower back pain.  Patient is alert and nontoxic.  No respiratory distress.  Lungs grossly clear.  Heart regular.  Abdomen is soft and nontender.  Patient can elevate the right lower extremity off of the bed and hold.  Patient is able to elevate the left but it is weaker and less capacity to resist downward pressure.  Also weaker on the left for plantar flexion.  Patient has had increasing falls with the left lower extremity giving way.  He also has back pain at this point have concern for spinal stenosis or cord impingement, will get this MRI lower back..  Patient will also need CT head for any possible head trauma and injury from frequent falls.    Charlesetta Shanks, MD 02/07/19 1452

## 2019-02-07 NOTE — Care Management (Signed)
Patient presented to ED with recurrent falls with weakness ED evaluation completed no significant finding. EDP recommendations is HH services for reconditions.  Patient has had [place] services in the past with [facility]. HH orders placed and faxed over to [facility] after hours fax line [phone].  CM will follow up with patient tomorrow evening.

## 2019-02-16 ENCOUNTER — Telehealth: Payer: Self-pay | Admitting: Family Medicine

## 2019-02-16 NOTE — Telephone Encounter (Signed)
Erlene Quan OT with [facility] is calling and needs verbal for PT 1x1, 2x4 and OT 1X1, 2X2 , 1X2 and home health aid 2x3 starting next week

## 2019-02-17 NOTE — Telephone Encounter (Signed)
Verbal given 

## 2019-02-17 NOTE — Telephone Encounter (Signed)
Okay 

## 2019-03-02 ENCOUNTER — Other Ambulatory Visit: Payer: Self-pay | Admitting: Family Medicine

## 2019-03-02 DIAGNOSIS — D539 Nutritional anemia, unspecified: Secondary | ICD-10-CM

## 2019-03-02 NOTE — Progress Notes (Signed)
SHRIRAM Goodwin - 85 y.o. male MRN [id number]  Date of birth: 1931-04-18  SUBJECTIVE:  Including CC & ROS.  Chief Complaint  Patient presents with  . Follow-up    follow up for left knee    Nathan Goodwin is a 85 y.o. male that is presenting with acute on chronic left knee pain.  He is tried steroid and gel injections out of offer little improvement of his pain.  He has had repeated falls secondary to the pain in the knee.  He has a Rollator that helps him ambulate.  Has had home health recently after his most recent visit to the emergency department.  CT of his head showed mild atrophy.  MRI of the lumbar spine showed multilevel disc and facet degeneration bilateral recess stenosis at L4-5..  Independent review of the left knee x-ray from 9/21 shows medial joint space narrowing.   Review of Systems  Constitutional: Negative for fever.  HENT: Negative for congestion.   Eyes: Positive for visual disturbance.  Respiratory: Negative for cough.   Cardiovascular: Negative for chest pain.  Gastrointestinal: Negative for abdominal pain.  Musculoskeletal: Positive for arthralgias and gait problem.  Skin: Negative for color change.  Neurological: Negative for weakness.  Hematological: Negative for adenopathy.    HISTORY: Past Medical, Surgical, Social, and Family History Reviewed & Updated per EMR.   Pertinent Historical Findings include:  Past Medical History:  Diagnosis Date  . Allergy   . Arthritis   . Benign essential HTN 03/09/2012  . Chicken pox   . Colon cancer ([place])    colon ca dx 3/11  . Diabetes mellitus   . 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 Hyperlipidemia   . Hypertension   . Prostate CA [facility])    prostate ca dx 1999  . Stage II carcinoma of colon ([place]) 03/09/2012   6.5 cm  Grade II adenoca transverse colon 25 nodes negative resected 08/14/09  . Tubular adenoma of colon 03/09/2012   Right hemicolectomy 05/14/10  . Type 2 DM w/severe nonproliferative  diabetic retinop and macular edema ([place]) 03/09/2012    No past surgical history on file.  No Known Allergies  Family History  Problem Relation Age of Onset  . Arthritis Mother   . Depression Mother   . Diabetes Mother   . Hypertension Mother   . Mental illness Mother   . Diabetes Father   . Hyperlipidemia Father   . Diabetes Brother   . Heart attack Brother   . Hyperlipidemia Brother   . Hypertension Brother   . Hyperlipidemia Daughter   . Diabetes Brother   . Heart attack Brother   . Hyperlipidemia Brother   . Hypertension Brother   . Hyperlipidemia Brother   . Hypertension Brother   . Diabetes Brother      Social History   Socioeconomic History  . Marital status: Married    Spouse name: Not on file  . Number of children: Not on file  . Years of education: Not on file  . Highest education level: Not on file  Occupational History  . Not on file  Social Needs  . Financial resource strain: Not on file  . Food insecurity    Worry: Not on file    Inability: Not on file  . Transportation needs    Medical: Not on file    Non-medical: Not on file  Tobacco Use  . Smoking status: Former Research scientist (life sciences)  . Smokeless tobacco: Never  Used  Substance and Sexual Activity  . Alcohol use: Not on file  . Drug use: Not on file  . Sexual activity: Not on file  Lifestyle  . Physical activity    Days per week: Not on file    Minutes per session: Not on file  . Stress: Not on file  Relationships  . Social Herbalist on phone: Not on file    Gets together: Not on file    Attends religious service: Not on file    Active member of club or organization: Not on file    Attends meetings of clubs or organizations: Not on file    Relationship status: Not on file  . Intimate partner violence    Fear of current or ex partner: Not on file    Emotionally abused: Not on file    Physically abused: Not on file    Forced sexual activity: Not on file  Other Topics Concern  . Not  on file  Social History Narrative  . Not on file     PHYSICAL EXAM:  VS: BP 108/66   Pulse (!) 57   Ht 5\' 5"  (1.651 m)   Wt 156 lb (70.8 kg)   BMI 25.96 kg/m  Physical Exam Gen: NAD, alert, cooperative with exam, well-appearing ENT: normal lips, normal nasal mucosa,  Eye: normal EOM, normal conjunctiva and lids CV:  no edema, +2 pedal pulses   Resp: no accessory muscle use, non-labored,  Skin: no rashes, no areas of induration  Neuro: normal tone, normal sensation to touch Psych:  normal insight, alert and oriented MSK:  Left knee: No obvious effusion. Normal range of motion. Instability with valgus and varus stress testing. Leg length discrepancy with left shorter than right. Neurovascularly intact     ASSESSMENT & PLAN:   OA (osteoarthritis) of knee Has had repeated falls and notes that he does not trust the left knee.  We have tried gel injections and steroid injections with little improvement. -Try medial unloader brace due to thigh to calf ratio. -Has home health therapy currently.  Left hip pain May be the source of his left knee pain.  Also has a leg length discrepancy noted on exam today. -X-ray. -Prescription written for custom shoe. -May need to try injections into the hip joint or greater trochanter.

## 2019-03-03 ENCOUNTER — Ambulatory Visit (HOSPITAL_BASED_OUTPATIENT_CLINIC_OR_DEPARTMENT_OTHER)
Admission: RE | Admit: 2019-03-03 | Discharge: 2019-03-03 | Disposition: A | Discharge status: Home / Self Care | Payer: Medicare Other | Source: Ambulatory Visit | Attending: Family Medicine | Admitting: Family Medicine

## 2019-03-03 ENCOUNTER — Encounter: Payer: Self-pay | Admitting: Family Medicine

## 2019-03-03 ENCOUNTER — Other Ambulatory Visit: Payer: Self-pay

## 2019-03-03 ENCOUNTER — Ambulatory Visit: Payer: Medicare Other | Admitting: Family Medicine

## 2019-03-03 VITALS — BP 108/66 | HR 57 | Ht 65.0 in | Wt 156.0 lb

## 2019-03-03 DIAGNOSIS — M25552 Pain in left hip: Secondary | ICD-10-CM | POA: Insufficient documentation

## 2019-03-03 DIAGNOSIS — M1712 Unilateral primary osteoarthritis, left knee: Secondary | ICD-10-CM | POA: Diagnosis not present

## 2019-03-03 NOTE — Assessment & Plan Note (Signed)
May be the source of his left knee pain.  Also has a leg length discrepancy noted on exam today. -X-ray. -Prescription written for custom shoe. -May need to try injections into the hip joint or greater trochanter.

## 2019-03-03 NOTE — Patient Instructions (Signed)
Good to see you Please try to get the custom shoes  Please continue therapy  I will call with the results from today   Please send me a message in MyChart with any questions or updates.  Please see me back in 4 weeks.   --Dr. Raeford Razor

## 2019-03-03 NOTE — Assessment & Plan Note (Signed)
Has had repeated falls and notes that he does not trust the left knee.  We have tried gel injections and steroid injections with little improvement. -Try medial unloader brace due to thigh to calf ratio. -Has home health therapy currently.

## 2019-03-04 ENCOUNTER — Telehealth: Payer: Self-pay | Admitting: Family Medicine

## 2019-03-04 NOTE — Telephone Encounter (Signed)
Spoke with patient about results. 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 Consider hip joint injection at follow up.   Rosemarie Ax, MD [facility] 03/04/2019, 8:18 AM

## 2019-03-14 ENCOUNTER — Other Ambulatory Visit: Payer: Self-pay | Admitting: Family Medicine

## 2019-03-14 DIAGNOSIS — M1712 Unilateral primary osteoarthritis, left knee: Secondary | ICD-10-CM

## 2019-03-22 ENCOUNTER — Other Ambulatory Visit: Payer: Self-pay

## 2019-03-22 DIAGNOSIS — E1122 Type 2 diabetes mellitus with diabetic chronic kidney disease: Secondary | ICD-10-CM

## 2019-03-22 DIAGNOSIS — N182 Chronic kidney disease, stage 2 (mild): Secondary | ICD-10-CM

## 2019-03-22 MED ORDER — PIOGLITAZONE HCL 15 MG PO TABS: ORAL_TABLET | ORAL | 1 refills | Status: DC

## 2019-03-23 ENCOUNTER — Other Ambulatory Visit: Payer: Self-pay

## 2019-03-23 DIAGNOSIS — E7800 Pure hypercholesterolemia, unspecified: Secondary | ICD-10-CM

## 2019-03-23 MED ORDER — ATORVASTATIN CALCIUM 20 MG PO TABS: ORAL_TABLET | ORAL | 1 refills | Status: DC

## 2019-04-01 ENCOUNTER — Encounter: Payer: Self-pay | Admitting: Family Medicine

## 2019-04-01 ENCOUNTER — Other Ambulatory Visit: Payer: Self-pay

## 2019-04-01 ENCOUNTER — Ambulatory Visit: Payer: Medicare Other | Admitting: Family Medicine

## 2019-04-01 DIAGNOSIS — M1712 Unilateral primary osteoarthritis, left knee: Secondary | ICD-10-CM

## 2019-04-01 NOTE — Assessment & Plan Note (Signed)
We have tried steroid injections and gel injections with limited improvement.  Have also done home health physical therapy to get him stronger and avoid falls.  He does not seem to tolerate the medial unloader brace all that well. -Counseled on home exercise therapy and supportive care. -Can follow-up for steroid injection.

## 2019-04-01 NOTE — Progress Notes (Signed)
Nathan Goodwin - 85 y.o. male MRN [id number]  Date of birth: 04-26-1931  SUBJECTIVE:  Including CC & ROS.  Chief Complaint  Patient presents with  . Follow-up    follow up for left knee    Nathan Goodwin is a 85 y.o. male that is following up for his left knee pain.  He has received gel injections as well as steroid injections in the left knee.  He gets mild improvement but the pain seems to return.  He is tried using the Voltaren.  He has tried a medial unloader brace with limited improvement.  He reports some numbness in his leg at the bottom of his foot.  He has done some physical therapy and is is walking a little bit better.  He has not had any falls.   Review of Systems  Constitutional: Negative for fever.  HENT: Negative for congestion.   Respiratory: Negative for cough.   Cardiovascular: Negative for chest pain.  Gastrointestinal: Negative for abdominal pain.  Musculoskeletal: Positive for arthralgias and gait problem.  Skin: Negative for color change.  Neurological: Negative for syncope.  Hematological: Negative for adenopathy.    HISTORY: Past Medical, Surgical, Social, and Family History Reviewed & Updated per EMR.   Pertinent Historical Findings include:  Past Medical History:  Diagnosis Date  . Allergy   . Arthritis   . Benign essential HTN 03/09/2012  . Chicken pox   . Colon cancer ([place])    colon ca dx 3/11  . Diabetes mellitus   . 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 Hyperlipidemia   . Hypertension   . Prostate CA [facility])    prostate ca dx 1999  . Stage II carcinoma of colon ([place]) 03/09/2012   6.5 cm  Grade II adenoca transverse colon 25 nodes negative resected 08/14/09  . Tubular adenoma of colon 03/09/2012   Right hemicolectomy 05/14/10  . 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  No Known Allergies  Family History  Problem Relation Age of Onset  .  Arthritis Mother   . Depression Mother   . Diabetes Mother   . Hypertension Mother   . Mental illness Mother   . Diabetes Father   . Hyperlipidemia Father   . Diabetes Brother   . Heart attack Brother   . Hyperlipidemia Brother   . Hypertension Brother   . Hyperlipidemia Daughter   . Diabetes Brother   . Heart attack Brother   . Hyperlipidemia Brother   . Hypertension Brother   . Hyperlipidemia Brother   . Hypertension Brother   . Diabetes Brother      Social History   Socioeconomic History  . Marital status: Married    Spouse name: Not on file  . Number of children: Not on file  . Years of education: Not on file  . Highest education level: Not on file  Occupational History  . Not on file  Social Needs  . Financial resource strain: Not on file  . Food insecurity    Worry: Not on file    Inability: Not on file  . Transportation needs    Medical: Not on file    Non-medical: Not on file  Tobacco Use  . Smoking status: Former Research scientist (life sciences)  . Smokeless tobacco: Never Used  Substance and Sexual Activity  . Alcohol use: Not on file  . Drug use: Not on file  .  Sexual activity: Not on file  Lifestyle  . Physical activity    Days per week: Not on file    Minutes per session: Not on file  . Stress: Not on file  Relationships  . Social Herbalist on phone: Not on file    Gets together: Not on file    Attends religious service: Not on file    Active member of club or organization: Not on file    Attends meetings of clubs or organizations: Not on file    Relationship status: Not on file  . Intimate partner violence    Fear of current or ex partner: Not on file    Emotionally abused: Not on file    Physically abused: Not on file    Forced sexual activity: Not on file  Other Topics Concern  . Not on file  Social History Narrative  . Not on file     PHYSICAL EXAM:  VS: BP 138/65   Pulse (!) 57   Ht 5\' 5"  (1.651 m)   Wt 143 lb 9.6 oz (65.1 kg)   BMI  23.90 kg/m  Physical Exam Gen: NAD, alert, cooperative with exam, well-appearing ENT: normal lips, normal nasal mucosa,  Eye: normal EOM, normal conjunctiva and lids CV:  no edema, +2 pedal pulses   Resp: no accessory muscle use, non-labored,  Skin: no rashes, no areas of induration  Neuro: normal tone, normal sensation to touch Psych:  normal insight, alert and oriented MSK:  Left knee: No obvious effusion. Valgus deformity. Instability with valgus and varus stress testing. Normal strength resistance. Neurovascularly intact     ASSESSMENT & PLAN:   OA (osteoarthritis) of knee We have tried steroid injections and gel injections with limited improvement.  Have also done home health physical therapy to get him stronger and avoid falls.  He does not seem to tolerate the medial unloader brace all that well. -Counseled on home exercise therapy and supportive care. -Can follow-up for steroid injection.

## 2019-04-01 NOTE — Patient Instructions (Signed)
Good to see you Please try the stand up and sit down exercises  Please try ice as needed  You don't have to use the brace if it hurts   Please send me a message in MyChart with any questions or updates.  Please see me back in 4-6 weeks and we can try another injection.   --Dr. Raeford Razor

## 2019-05-09 ENCOUNTER — Encounter: Payer: Self-pay | Admitting: Family Medicine

## 2019-05-09 ENCOUNTER — Ambulatory Visit: Payer: Medicare Other | Admitting: Family Medicine

## 2019-05-09 ENCOUNTER — Other Ambulatory Visit: Payer: Self-pay | Admitting: Family Medicine

## 2019-05-09 ENCOUNTER — Other Ambulatory Visit: Payer: Self-pay

## 2019-05-09 ENCOUNTER — Ambulatory Visit: Payer: Self-pay

## 2019-05-09 VITALS — BP 134/63 | HR 49 | Ht 65.0 in | Wt 153.0 lb

## 2019-05-09 DIAGNOSIS — M1712 Unilateral primary osteoarthritis, left knee: Secondary | ICD-10-CM

## 2019-05-09 DIAGNOSIS — I10 Essential (primary) hypertension: Secondary | ICD-10-CM

## 2019-05-09 MED ORDER — TRIAMCINOLONE ACETONIDE 40 MG/ML IJ SUSP
40.0000 mg | Freq: Once | INTRAMUSCULAR | Status: AC
  Administered 2019-05-09: 11:00:00 40 mg via INTRA_ARTICULAR

## 2019-05-09 MED ORDER — METOPROLOL SUCCINATE ER 50 MG PO TB24: ORAL_TABLET | ORAL | 0 refills | Status: DC

## 2019-05-09 NOTE — Patient Instructions (Signed)
Good to see you Please try the rub on medicine  Please try ice  Please continue the exercises   Please send me a message in MyChart with any questions or updates.  Please see me back in 3 months.   --Dr. Raeford Razor

## 2019-05-09 NOTE — Progress Notes (Signed)
Medication Samples have been provided to the patient.  Drug name: Pennsaid       Strength: 2%        Qty: 2 boxes  LOT: [id number] Exp.Date: 07/2019  Dosing instructions: Use a pea size amount and rub gently.  The patient has been instructed regarding the correct time, dose, and frequency of taking this medication, including desired effects and most common side effects.   Sherrie George, MA 10:30 AM 05/09/2019

## 2019-05-09 NOTE — Progress Notes (Signed)
Nathan Goodwin - 85 y.o. male MRN [id number]  Date of birth: [birth year]  SUBJECTIVE:  Including CC & ROS.  Chief Complaint  Patient presents with  . Follow-up    follow up for left knee    RAHMAN AGOSTA is a 85 y.o. male that is presenting with acute on chronic left knee pain.  He has tried steroid injections in the past as well as gel injections.  He tried a medial unloader brace with no improvement.  His pain is intermittent in nature.  Seems to be worse at night.  He does get throbbing from time to time.  He tries different rub on therapies with limited improvement.  He does use a walker as well as a cane to help with ambulation.  At times he does not have to use any assistance.  Pain seems to be localized to the medial joint line.  It can be moderate to severe.   Review of Systems  Constitutional: Negative for fever.  HENT: Negative for congestion.   Respiratory: Negative for cough.   Cardiovascular: Negative for chest pain.  Gastrointestinal: Negative for abdominal pain.  Musculoskeletal: Positive for arthralgias, gait problem and joint swelling.  Skin: Negative for color change.  Neurological: Negative for weakness.  Hematological: Negative for adenopathy.    HISTORY: Past Medical, Surgical, Social, and Family History Reviewed & Updated per EMR.   Pertinent Historical Findings include:  Past Medical History:  Diagnosis Date  . Allergy   . Arthritis   . Benign essential HTN 03/09/2012  . Chicken pox   . Colon cancer ([place])    colon ca dx 3/11  . Diabetes mellitus   . 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 Hyperlipidemia   . Hypertension   . Prostate CA [facility])    prostate ca dx 1999  . Stage II carcinoma of colon ([place]) 03/09/2012   6.5 cm  Grade II adenoca transverse colon 25 nodes negative resected 08/14/09  . Tubular adenoma of colon 03/09/2012   Right hemicolectomy 05/14/10  . 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  No Known Allergies  Family History  Problem Relation Age of Onset  . Arthritis Mother   . Depression Mother   . Diabetes Mother   . Hypertension Mother   . Mental illness Mother   . Diabetes Father   . Hyperlipidemia Father   . Diabetes Brother   . Heart attack Brother   . Hyperlipidemia Brother   . Hypertension Brother   . Hyperlipidemia Daughter   . Diabetes Brother   . Heart attack Brother   . Hyperlipidemia Brother   . Hypertension Brother   . Hyperlipidemia Brother   . Hypertension Brother   . Diabetes Brother      Social History   Socioeconomic History  . Marital status: Married    Spouse name: Not on file  . Number of children: Not on file  . Years of education: Not on file  . Highest education level: Not on file  Occupational History  . Not on file  Tobacco Use  . Smoking status: Former Research scientist (life sciences)  . Smokeless tobacco: Never Used  Substance and Sexual Activity  . Alcohol use: Not on file  . Drug use: Not on file  . Sexual activity: Not on file  Other Topics Concern  . Not on file  Social History Narrative  . Not  on file   Social Determinants of Health   Financial Resource Strain:   . Difficulty of Paying Living Expenses: Not on file  Food Insecurity:   . Worried About Charity fundraiser in the Last Year: Not on file  . Ran Out of Food in the Last Year: Not on file  Transportation Needs:   . Lack of Transportation (Medical): Not on file  . Lack of Transportation (Non-Medical): Not on file  Physical Activity:   . Days of Exercise per Week: Not on file  . Minutes of Exercise per Session: Not on file  Stress:   . Feeling of Stress : Not on file  Social Connections:   . Frequency of Communication with Friends and Family: Not on file  . Frequency of Social Gatherings with Friends and Family: Not on file  . Attends Religious Services: Not on file  . Active Member of Clubs or Organizations: Not on  file  . Attends Archivist Meetings: Not on file  . Marital Status: Not on file  Intimate Partner Violence:   . Fear of Current or Ex-Partner: Not on file  . Emotionally Abused: Not on file  . Physically Abused: Not on file  . Sexually Abused: Not on file     PHYSICAL EXAM:  VS: BP 134/63   Pulse (!) 49   Ht 5\' 5"  (1.651 m)   Wt 153 lb (69.4 kg)   BMI 25.46 kg/m  Physical Exam Gen: NAD, alert, cooperative with exam, well-appearing ENT: normal lips, normal nasal mucosa,  Eye: normal EOM, normal conjunctiva and lids CV:  no edema, +2 pedal pulses   Resp: no accessory muscle use, non-labored,  Skin: no rashes, no areas of induration  Neuro: normal tone, normal sensation to touch Psych:  normal insight, alert and oriented MSK:  Left knee: No obvious effusion. Normal range of motion. Tenderness to palpation of the medial joint line. Neurovascularly intact   Aspiration/Injection Procedure Note RASAN WIEDERKEHR 1931/02/22  Procedure: Injection Indications: Left knee pain  Procedure Details Consent: Risks of procedure as well as the alternatives and risks of each were explained to the (patient/caregiver).  Consent for procedure obtained. Time Out: Verified patient identification, verified procedure, site/side was marked, verified correct patient position, special equipment/implants available, medications/allergies/relevent history reviewed, required imaging and test results available.  Performed.  The area was cleaned with iodine and alcohol swabs.    The left knee superior lateral suprapatellar pouch was injected using 1 cc's of 40 mg Kenalog and 4 cc's of 0.25% bupivacaine with a 22 1 1/2" needle.  Ultrasound was used. Images were obtained in long views showing the injection.     A sterile dressing was applied.  Patient did tolerate procedure well.      ASSESSMENT & PLAN:   OA (osteoarthritis) of knee Acute on chronic in nature.  Worsened as of late.  Has  tried steroid injections, gel injections and different bracing. -Injection today. -Provided Pennsaid samples. -Counseled on home exercise therapy and supportive care. -Can follow-up in 3 months.

## 2019-05-09 NOTE — Assessment & Plan Note (Signed)
Acute on chronic in nature.  Worsened as of late.  Has tried steroid injections, gel injections and different bracing. -Injection today. -Provided Pennsaid samples. -Counseled on home exercise therapy and supportive care. -Can follow-up in 3 months.

## 2019-05-10 ENCOUNTER — Ambulatory Visit: Payer: Medicare Other | Admitting: Family Medicine

## 2019-07-04 ENCOUNTER — Ambulatory Visit (INDEPENDENT_AMBULATORY_CARE_PROVIDER_SITE_OTHER): Payer: Medicare Other | Admitting: Family Medicine

## 2019-07-04 ENCOUNTER — Encounter: Payer: Self-pay | Admitting: Family Medicine

## 2019-07-04 ENCOUNTER — Other Ambulatory Visit: Payer: Self-pay

## 2019-07-04 VITALS — BP 122/62 | HR 54 | Temp 97.6°F | Ht 65.0 in | Wt 140.8 lb

## 2019-07-04 DIAGNOSIS — E113419 Type 2 diabetes mellitus with severe nonproliferative diabetic retinopathy with macular edema, unspecified eye: Secondary | ICD-10-CM

## 2019-07-04 DIAGNOSIS — N1832 Chronic kidney disease, stage 3b: Secondary | ICD-10-CM

## 2019-07-04 DIAGNOSIS — D649 Anemia, unspecified: Secondary | ICD-10-CM | POA: Diagnosis not present

## 2019-07-04 DIAGNOSIS — I10 Essential (primary) hypertension: Secondary | ICD-10-CM

## 2019-07-04 DIAGNOSIS — E7800 Pure hypercholesterolemia, unspecified: Secondary | ICD-10-CM

## 2019-07-04 LAB — BASIC METABOLIC PANEL
BUN: 20 mg/dL (ref 6–23)
CO2: 27 mEq/L (ref 19–32)
Calcium: 9.1 mg/dL (ref 8.4–10.5)
Chloride: 103 mEq/L (ref 96–112)
Creatinine, Ser: 1.72 mg/dL — ABNORMAL HIGH (ref 0.40–1.50)
GFR: 45.82 mL/min — ABNORMAL LOW (ref >60.00)
Glucose, Bld: 101 mg/dL — ABNORMAL HIGH (ref 70–99)
Potassium: 4.3 mEq/L (ref 3.5–5.1)
Sodium: 136 mEq/L (ref 135–145)

## 2019-07-04 LAB — VITAMIN B12: Vitamin B-12: 1183 pg/mL — ABNORMAL HIGH (ref 211–911)

## 2019-07-04 LAB — CBC
HCT: 30.7 % — ABNORMAL LOW (ref 39.0–52.0)
Hemoglobin: 10.2 g/dL — ABNORMAL LOW (ref 13.0–17.0)
MCHC: 33.2 g/dL (ref 30.0–36.0)
MCV: 97.6 fl (ref 78.0–100.0)
Platelets: 211 10*3/uL (ref 150.0–400.0)
RBC: 3.14 Mil/uL — ABNORMAL LOW (ref 4.22–5.81)
RDW: 14 % (ref 11.5–15.5)
WBC: 5.1 10*3/uL (ref 4.0–10.5)

## 2019-07-04 LAB — LDL CHOLESTEROL, DIRECT: Direct LDL: 87 mg/dL

## 2019-07-04 LAB — HEMOGLOBIN A1C: Hgb A1c MFr Bld: 5.4 % (ref 4.6–6.5)

## 2019-07-04 NOTE — Progress Notes (Signed)
Established Patient Office Visit  Subjective:  Patient ID: Nathan Goodwin, male    DOB: [birth year]  Age: 86 y.o. MRN: [id number]  CC:  Chief Complaint  Patient presents with  . Follow-up    6 month f/u on BP, no concerns.     HPI Nathan Goodwin presents for follow-up of his hypertension, diabetes, elevated cholesterol, B12 deficiency and iron deficiency.  Continues to take store atorvastatin, iron twice daily, metoprolol, Actos as directed.  Has run out of the B12.  Accompanied by his daughter today.  Continues to use as CAT for transportation and daughter is concerned.  Past Medical History:  Diagnosis Date  . Allergy   . Arthritis   . Benign essential HTN 03/09/2012  . Chicken pox   . Colon cancer ([place])    colon ca dx 3/11  . Diabetes mellitus   . 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 Hyperlipidemia   . Hypertension   . Prostate CA [facility])    prostate ca dx 1999  . Stage II carcinoma of colon ([place]) 03/09/2012   6.5 cm  Grade II adenoca transverse colon 25 nodes negative resected 08/14/09  . Tubular adenoma of colon 03/09/2012   Right hemicolectomy 05/14/10  . 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  Family History  Problem Relation Age of Onset  . Arthritis Mother   . Depression Mother   . Diabetes Mother   . Hypertension Mother   . Mental illness Mother   . Diabetes Father   . Hyperlipidemia Father   . Diabetes Brother   . Heart attack Brother   . Hyperlipidemia Brother   . Hypertension Brother   . Hyperlipidemia Daughter   . Diabetes Brother   . Heart attack Brother   . Hyperlipidemia Brother   . Hypertension Brother   . Hyperlipidemia Brother   . Hypertension Brother   . Diabetes Brother     Social History   Socioeconomic History  . Marital status: Married    Spouse name: Not on file  . Number of children: Not on file  . Years of education: Not on file   . Highest education level: Not on file  Occupational History  . Not on file  Tobacco Use  . Smoking status: Former Research scientist (life sciences)  . Smokeless tobacco: Never Used  Substance and Sexual Activity  . Alcohol use: Not on file  . Drug use: Not on file  . Sexual activity: Not on file  Other Topics Concern  . Not on file  Social History Narrative  . Not on file   Social Determinants of Health   Financial Resource Strain:   . Difficulty of Paying Living Expenses: Not on file  Food Insecurity:   . Worried About Charity fundraiser in the Last Year: Not on file  . Ran Out of Food in the Last Year: Not on file  Transportation Needs:   . Lack of Transportation (Medical): Not on file  . Lack of Transportation (Non-Medical): Not on file  Physical Activity:   . Days of Exercise per Week: Not on file  . Minutes of Exercise per Session: Not on file  Stress:   . Feeling of Stress : Not on file  Social Connections:   . Frequency of Communication with Friends and Family: Not on file  . Frequency of Social Gatherings with Friends and Family: Not  on file  . Attends Religious Services: Not on file  . Active Member of Clubs or Organizations: Not on file  . Attends Archivist Meetings: Not on file  . Marital Status: Not on file  Intimate Partner Violence:   . Fear of Current or Ex-Partner: Not on file  . Emotionally Abused: Not on file  . Physically Abused: Not on file  . Sexually Abused: Not on file    Outpatient Medications Prior to Visit  Medication Sig Dispense Refill  . atorvastatin (LIPITOR) 20 MG tablet TAKE 1 TABLET(20 MG) BY MOUTH DAILY 90 tablet 1  . diclofenac sodium (VOLTAREN) 1 % GEL APPLY 2 GRAMS EXTERNALLY TO THE AFFECTED AREA TWICE DAILY AS NEEDED FOR KNEE PAIN 100 g 1  . FEROSUL 325 (65 Fe) MG tablet TAKE 1 TABLET BY MOUTH TWICE DAILY WITH A MEAL 60 tablet 3  . glucose blood (ONE TOUCH ULTRA TEST) test strip Use to test blood sugars 1-2 times daily. 100 each 3  .  metoprolol succinate (TOPROL-XL) 50 MG 24 hr tablet TAKE 1 TABLET BY MOUTH EVERY DAY WITH OR IMMEDIATELY FOLLOWING A MEAL 90 tablet 0  . pioglitazone (ACTOS) 15 MG tablet TAKE 1 TABLET(15 MG) BY MOUTH DAILY 90 tablet 1  . alfuzosin (UROXATRAL) 10 MG 24 hr tablet TK 1 T PO QD  3  . b complex vitamins capsule Take 1 capsule by mouth daily. (Patient not taking: Reported on 07/04/2019) 100 capsule 1  . B-12 TR 2000 MCG TBCR TAKE 1 TABLET BY MOUTH EVERY DAY (Patient not taking: Reported on 07/04/2019) 100 tablet 1  . megestrol (MEGACE) 20 MG tablet TK 1 T PO  BID PRN  11  . Skin Protectants, Misc. (EUCERIN) cream Apply topically as needed for dry skin. (Patient not taking: Reported on 07/04/2019) 454 g 12  . triamcinolone ointment (KENALOG) 0.1 % Apply 1 application topically 2 (two) times daily. To affected areas (Patient not taking: Reported on 07/04/2019) 60 g 6   No facility-administered medications prior to visit.    No Known Allergies  ROS Review of Systems  Constitutional: Negative.   HENT: Positive for hearing loss.   Eyes: Positive for visual disturbance.  Respiratory: Negative.   Cardiovascular: Negative.   Genitourinary: Negative.   Musculoskeletal: Negative for joint swelling and myalgias.  Skin: Negative for color change.  Neurological: Negative.   Hematological: Negative.   Psychiatric/Behavioral: Negative.       Objective:    Physical Exam  Constitutional: He is oriented to person, place, and time. He appears well-developed and well-nourished. No distress.  HENT:  Head: Normocephalic and atraumatic.  Right Ear: External ear normal.  Left Ear: External ear normal.  Neck: No JVD present. No tracheal deviation present. No thyromegaly present.  Cardiovascular: Normal rate, regular rhythm and normal heart sounds.  Pulmonary/Chest: Effort normal and breath sounds normal. No stridor.  Abdominal: Bowel sounds are normal.  Musculoskeletal:        General: No edema.   Lymphadenopathy:    He has no cervical adenopathy.  Neurological: He is alert and oriented to person, place, and time.  Skin: Skin is warm and dry. He is not diaphoretic.  Psychiatric: He has a normal mood and affect. His behavior is normal.    BP 122/62   Pulse (!) 54   Temp 97.6 F (36.4 C) (Tympanic)   Ht 5\' 5"  (1.651 m)   Wt 140 lb 12.8 oz (63.9 kg)   SpO2 97%  BMI 23.43 kg/m  Wt Readings from Last 3 Encounters:  07/04/19 140 lb 12.8 oz (63.9 kg)  05/09/19 153 lb (69.4 kg)  04/01/19 143 lb 9.6 oz (65.1 kg)     Health Maintenance Due  Topic Date Due  . FOOT EXAM  04/13/1943  . OPHTHALMOLOGY EXAM  04/13/1943  . TETANUS/TDAP  04/12/1952  . URINE MICROALBUMIN  03/27/2019  . HEMOGLOBIN A1C  07/03/2019    There are no preventive care reminders to display for this patient.   Lab Results  Component Value Date   WBC 8.3 02/07/2019   HGB 10.8 (L) 02/07/2019   HCT 33.5 (L) 02/07/2019   MCV 97.4 02/07/2019   PLT 249 02/07/2019   Lab Results  Component Value Date   NA 134 (L) 02/07/2019   K 4.5 02/07/2019   CO2 26 02/07/2019   GLUCOSE 118 (H) 02/07/2019   BUN 18 02/07/2019   CREATININE 1.59 (H) 02/07/2019   BILITOT 0.5 12/24/2017   ALKPHOS 39 12/24/2017   AST 14 12/24/2017   ALT 9 12/24/2017   PROT 7.0 12/24/2017   ALBUMIN 3.9 12/24/2017   CALCIUM 9.3 02/07/2019   ANIONGAP 7 02/07/2019   GFR 47.47 (L) 12/31/2018   Lab Results  Component Value Date   CHOL 151 12/24/2017   Lab Results  Component Value Date   HDL 55.00 12/24/2017   Lab Results  Component Value Date   LDLCALC 64 12/24/2017   Lab Results  Component Value Date   TRIG 163.0 (H) 12/24/2017   Lab Results  Component Value Date   CHOLHDL 3 12/24/2017   Lab Results  Component Value Date   HGBA1C 5.7 12/31/2018      Assessment & Plan:   Problem List Items Addressed This Visit      Cardiovascular and Mediastinum   Benign essential HTN - Primary   Relevant Orders   Basic  metabolic panel     Endocrine   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   Follow-up: Return in about 6 months (around 01/01/2020), or hope to lower iron and b12 doses..  Hopefully can he can switch to the multivitamin with B12 in it.  Might be able to decrease the iron to daily.  Continue all medicines as above.  Recommended skin moisturizers and gentle soaps for his dry skin. Libby Maw, MD

## 2019-07-05 LAB — IRON,TIBC AND FERRITIN PANEL
%SAT: 23 % (calc) (ref 20–48)
Ferritin: 292 ng/mL (ref 24–380)
Iron: 69 ug/dL (ref 50–180)
TIBC: 300 mcg/dL (calc) (ref 250–425)

## 2019-07-25 ENCOUNTER — Telehealth: Payer: Self-pay | Admitting: Family Medicine

## 2019-07-25 NOTE — Telephone Encounter (Addendum)
Patient daughter is calling Nathan Goodwin about the iron RX. Patient father is not sure if he still needs to be taking this medication. Please call patient.

## 2019-07-25 NOTE — Telephone Encounter (Signed)
Returned call, no answer LMTCB 

## 2019-07-26 NOTE — Telephone Encounter (Signed)
Spoke with patient and daughter who both verbally understood patient can stop taking iron and B-12 but to continue with multivitamins with iron.

## 2019-07-26 NOTE — Progress Notes (Signed)
Nurse connected with patient 07/27/19 at  1:45 PM EST by a telephone enabled telemedicine application and verified that I am speaking with the correct person using two identifiers. Patient stated full name and DOB. Patient gave permission to continue with virtual visit. Patient's location was at home and Nurse's location was at [place] office.   Subjective:   Nathan Goodwin is a 86 y.o. male who presents for an Initial Medicare Annual Wellness Visit.  Review of Systems No ROS.  Medicare Wellness Virtual Visit.  Visual/audio telehealth visit, UTA vital signs.   See social history for additional risk factors.  Home Safety/Smoke Alarms: Feels safe in home. Smoke alarms in place.  Lives alone. Uses cane. Dtr's visit often.   Male:   PSA- No results found for: PSA    Objective:     Advanced Directives 07/27/2019 06/08/2016 06/08/2016  Does Patient Have a Medical Advance Directive? No No No  Would patient like information on creating a medical advance directive? No - Patient declined No - Patient declined No - Patient declined    Current Medications (verified) Outpatient Encounter Medications as of 07/27/2019  Medication Sig  . alfuzosin (UROXATRAL) 10 MG 24 hr tablet TK 1 T PO QD  . atorvastatin (LIPITOR) 20 MG tablet TAKE 1 TABLET(20 MG) BY MOUTH DAILY  . b complex vitamins capsule Take 1 capsule by mouth daily.  . B-12 TR 2000 MCG TBCR TAKE 1 TABLET BY MOUTH EVERY DAY  . diclofenac sodium (VOLTAREN) 1 % GEL APPLY 2 GRAMS EXTERNALLY TO THE AFFECTED AREA TWICE DAILY AS NEEDED FOR KNEE PAIN  . FEROSUL 325 (65 Fe) MG tablet TAKE 1 TABLET BY MOUTH TWICE DAILY WITH A MEAL  . glucose blood (ONE TOUCH ULTRA TEST) test strip Use to test blood sugars 1-2 times daily.  . megestrol (MEGACE) 20 MG tablet TK 1 T PO  BID PRN  . metoprolol succinate (TOPROL-XL) 50 MG 24 hr tablet TAKE 1 TABLET BY MOUTH EVERY DAY WITH OR IMMEDIATELY FOLLOWING A MEAL  . pioglitazone (ACTOS) 15 MG tablet TAKE 1  TABLET(15 MG) BY MOUTH DAILY  . Skin Protectants, Misc. (EUCERIN) cream Apply topically as needed for dry skin.  Marland Kitchen triamcinolone ointment (KENALOG) 0.1 % Apply 1 application topically 2 (two) times daily. To affected areas   No facility-administered encounter medications on file as of 07/27/2019.    Allergies (verified) Patient has no known allergies.   History: Past Medical History:  Diagnosis Date  . Allergy   . Arthritis   . Benign essential HTN 03/09/2012  . Chicken pox   . Colon cancer ([place])    colon ca dx 3/11  . Diabetes mellitus   . 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 Hyperlipidemia   . Hypertension   . Prostate CA [facility])    prostate ca dx 1999  . Stage II carcinoma of colon ([place]) 03/09/2012   6.5 cm  Grade II adenoca transverse colon 25 nodes negative resected 08/14/09  . Tubular adenoma of colon 03/09/2012   Right hemicolectomy 05/14/10  . 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 No pertinent surgical history. Family History  Problem Relation Age of Onset  . Arthritis Mother   . Depression Mother   . Diabetes Mother   . Hypertension Mother   . Mental illness Mother   . Diabetes Father   . Hyperlipidemia Father   . Diabetes Brother   .  Heart attack Brother   . Hyperlipidemia Brother   . Hypertension Brother   . Hyperlipidemia Daughter   . Diabetes Brother   . Heart attack Brother   . Hyperlipidemia Brother   . Hypertension Brother   . Hyperlipidemia Brother   . Hypertension Brother   . Diabetes Brother    Social History   Socioeconomic History  . Marital status: Married    Spouse name: Not on file  . Number of children: Not on file  . Years of education: Not on file  . Highest education level: Not on file  Occupational History  . Not on file  Tobacco Use  . Smoking status: Former Research scientist (life sciences)  . Smokeless tobacco: Never Used  Substance and Sexual Activity  . Alcohol  use: Not on file  . Drug use: Not on file  . Sexual activity: Not on file  Other Topics Concern  . Not on file  Social History Narrative  . Not on file   Social Determinants of Health   Financial Resource Strain: Low Risk   . Difficulty of Paying Living Expenses: Not hard at all  Food Insecurity: No Food Insecurity  . Worried About Charity fundraiser in the Last Year: Never true  . Ran Out of Food in the Last Year: Never true  Transportation Needs: No Transportation Needs  . Lack of Transportation (Medical): No  . Lack of Transportation (Non-Medical): No  Physical Activity:   . Days of Exercise per Week: Not on file  . Minutes of Exercise per Session: Not on file  Stress:   . Feeling of Stress : Not on file  Social Connections:   . Frequency of Communication with Friends and Family: Not on file  . Frequency of Social Gatherings with Friends and Family: Not on file  . Attends Religious Services: Not on file  . Active Member of Clubs or Organizations: Not on file  . Attends Archivist Meetings: Not on file  . Marital Status: Not on file   Tobacco Counseling Counseling given: Not Answered   Clinical Intake: Pain : No/denies pain    Activities of Daily Living In your present state of health, do you have any difficulty performing the following activities: 07/27/2019  Hearing? Y  Comment wears hearing aids.  Vision? N  Difficulty concentrating or making decisions? N  Walking or climbing stairs? N  Dressing or bathing? N  Doing errands, shopping? N  Preparing Food and eating ? N  Using the Toilet? N  In the past six months, have you accidently leaked urine? N  Do you have problems with loss of bowel control? N  Managing your Medications? N  Managing your Finances? N  Housekeeping or managing your Housekeeping? N  Some recent data might be hidden     Immunizations and Health Maintenance Immunization History  Administered Date(s) Administered  .  Influenza, High Dose Seasonal PF 03/26/2018  . Influenza-Unspecified 02/16/2017  . Pneumococcal Conjugate-13 11/27/2016  . Pneumococcal Polysaccharide-23 06/09/2016   Health Maintenance Due  Topic Date Due  . FOOT EXAM  04/13/1941  . OPHTHALMOLOGY EXAM  04/13/1943  . TETANUS/TDAP  04/12/1952  . URINE MICROALBUMIN  03/27/2019    Patient Care Team: Libby Maw, MD as PCP - General (Family Medicine)  Indicate any recent Medical Services you may have received from other than [facility] providers in the past year (date may be approximate).    Assessment:   This is a routine wellness examination for [place].  Physical assessment deferred to PCP.  Hearing/Vision screen Unable to assess. This visit is enabled though telemedicine due to Covid 19.   Dietary issues and exercise activities discussed: Current Exercise Habits: Home exercise routine, Type of exercise: walking, Time (Minutes): 10, Frequency (Times/Week): 7, Weekly Exercise (Minutes/Week): 70, Intensity: Mild, Exercise limited by: None identified Diet (meal preparation, eat out, water intake, caffeinated beverages, dairy products, fruits and vegetables): well balanced   Goals    . Maintain health and independence      Depression Screen PHQ 2/9 Scores 07/27/2019  PHQ - 2 Score 0    Fall Risk Fall Risk  07/27/2019 12/24/2017  Falls in the past year? 0 No  Number falls in past yr: 0 -  Injury with Fall? 0 -  Follow up Education provided;Falls prevention discussed -    Cognitive Function: MMSE - Mini Mental State Exam 07/27/2019  Not completed: Refused    Ad8 score reviewed for issues:  Issues making decisions:no  Less interest in hobbies / activities:no  Repeats questions, stories (family complaining):no  Trouble using ordinary gadgets (microwave, computer, phone):no  Forgets the month or year: no  Mismanaging finances: no  Remembering appts:no  Daily problems with thinking and/or memory:no Ad8 score  is=0       Screening Tests Health Maintenance  Topic Date Due  . FOOT EXAM  04[birth year]13/1941  . OPHTHALMOLOGY EXAM  04/13/1943  . TETANUS/TDAP  04/12/1952  . URINE MICROALBUMIN  03/27/2019  . HEMOGLOBIN A1C  01/01/2020  . INFLUENZA VACCINE  Completed  . PNA vac Low Risk Adult  Completed      Plan:    Please schedule your next medicare wellness visit with me in 1 yr.  Continue to eat heart healthy diet (full of fruits, vegetables, whole grains, lean protein, water--limit salt, fat, and sugar intake) and increase physical activity as tolerated.  Continue doing brain stimulating activities (puzzles, reading, adult coloring books, staying active) to keep memory sharp.     I have personally reviewed and noted the following in the patient's chart:   . Medical and social history . Use of alcohol, tobacco or illicit drugs  . Current medications and supplements . Functional ability and status . Nutritional status . Physical activity . Advanced directives . List of other physicians . Hospitalizations, surgeries, and ER visits in previous 12 months . Vitals . Screenings to include cognitive, depression, and falls . Referrals and appointments  In addition, I have reviewed and discussed with patient certain preventive protocols, quality metrics, and best practice recommendations. A written personalized care plan for preventive services as well as general preventive health recommendations were provided to patient.     Shela Nevin, [place]   07/27/2019

## 2019-07-27 ENCOUNTER — Ambulatory Visit (INDEPENDENT_AMBULATORY_CARE_PROVIDER_SITE_OTHER): Payer: Medicare Other | Admitting: *Deleted

## 2019-07-27 ENCOUNTER — Encounter: Payer: Self-pay | Admitting: *Deleted

## 2019-07-27 DIAGNOSIS — Z0000 Encounter for general adult medical examination without abnormal findings: Secondary | ICD-10-CM

## 2019-07-27 NOTE — Patient Instructions (Signed)
Please schedule your next medicare wellness visit with me in 1 yr.  Continue to eat heart healthy diet (full of fruits, vegetables, whole grains, lean protein, water--limit salt, fat, and sugar intake) and increase physical activity as tolerated.  Continue doing brain stimulating activities (puzzles, reading, adult coloring books, staying active) to keep memory sharp.    Nathan Goodwin , Thank you for taking time to come for your Medicare Wellness Visit. I appreciate your ongoing commitment to your health goals. Please review the following plan we discussed and let me know if I can assist you in the future.   These are the goals we discussed: Goals    . Maintain health and independence       This is a list of the screening recommended for you and due dates:  Health Maintenance  Topic Date Due  . Complete foot exam   04/13/1943  . Eye exam for diabetics  04/13/1943  . Tetanus Vaccine  04/12/1952  . Urine Protein Check  03/27/2019  . Hemoglobin A1C  01/01/2020  . Flu Shot  Completed  . Pneumonia vaccines  Completed    Preventive Care 63 Years and Older, Male Preventive care refers to lifestyle choices and visits with your health care provider that can promote health and wellness. This includes:  A yearly physical exam. This is also called an annual well check.  Regular dental and eye exams.  Immunizations.  Screening for certain conditions.  Healthy lifestyle choices, such as diet and exercise. What can I expect for my preventive care visit? Physical exam Your health care provider will check:  Height and weight. These may be used to calculate body mass index (BMI), which is a measurement that tells if you are at a healthy weight.  Heart rate and blood pressure.  Your skin for abnormal spots. Counseling Your health care provider may ask you questions about:  Alcohol, tobacco, and drug use.  Emotional well-being.  Home and relationship well-being.  Sexual  activity.  Eating habits.  History of falls.  Memory and ability to understand (cognition).  Work and work Statistician. What immunizations do I need?  Influenza (flu) vaccine  This is recommended every year. Tetanus, diphtheria, and pertussis (Tdap) vaccine  You may need a Td booster every 10 years. Varicella (chickenpox) vaccine  You may need this vaccine if you have not already been vaccinated. Zoster (shingles) vaccine  You may need this after age 6. Pneumococcal conjugate (PCV13) vaccine  One dose is recommended after age 68. Pneumococcal polysaccharide (PPSV23) vaccine  One dose is recommended after age 86. Measles, mumps, and rubella (MMR) vaccine  You may need at least one dose of MMR if you were born in 1957 or later. You may also need a second dose. Meningococcal conjugate (MenACWY) vaccine  You may need this if you have certain conditions. Hepatitis A vaccine  You may need this if you have certain conditions or if you travel or work in places where you may be exposed to hepatitis A. Hepatitis B vaccine  You may need this if you have certain conditions or if you travel or work in places where you may be exposed to hepatitis B. Haemophilus influenzae type b (Hib) vaccine  You may need this if you have certain conditions. You may receive vaccines as individual doses or as more than one vaccine together in one shot (combination vaccines). Talk with your health care provider about the risks and benefits of combination vaccines. What tests do I need?  Blood tests  Lipid and cholesterol levels. These may be checked every 5 years, or more frequently depending on your overall health.  Hepatitis C test.  Hepatitis B test. Screening  Lung cancer screening. You may have this screening every year starting at age 42 if you have a 30-pack-year history of smoking and currently smoke or have quit within the past 15 years.  Colorectal cancer screening. All adults  should have this screening starting at age 28 and continuing until age 75. Your health care provider may recommend screening at age 31 if you are at increased risk. You will have tests every 1-10 years, depending on your results and the type of screening test.  Prostate cancer screening. Recommendations will vary depending on your family history and other risks.  Diabetes screening. This is done by checking your blood sugar (glucose) after you have not eaten for a while (fasting). You may have this done every 1-3 years.  Abdominal aortic aneurysm (AAA) screening. You may need this if you are a current or former smoker.  Sexually transmitted disease (STD) testing. Follow these instructions at home: Eating and drinking  Eat a diet that includes fresh fruits and vegetables, whole grains, lean protein, and low-fat dairy products. Limit your intake of foods with high amounts of sugar, saturated fats, and salt.  Take vitamin and mineral supplements as recommended by your health care provider.  Do not drink alcohol if your health care provider tells you not to drink.  If you drink alcohol: ? Limit how much you have to 0-2 drinks a day. ? Be aware of how much alcohol is in your drink. In the [place] one drink equals one 12 oz bottle of beer (355 mL), one 5 oz glass of wine (148 mL), or one 1 oz glass of hard liquor (44 mL). Lifestyle  Take daily care of your teeth and gums.  Stay active. Exercise for at least 30 minutes on 5 or more days each week.  Do not use any products that contain nicotine or tobacco, such as cigarettes, e-cigarettes, and chewing tobacco. If you need help quitting, ask your health care provider.  If you are sexually active, practice safe sex. Use a condom or other form of protection to prevent STIs (sexually transmitted infections).  Talk with your health care provider about taking a low-dose aspirin or statin. What's next?  Visit your health care provider once a year  for a well check visit.  Ask your health care provider how often you should have your eyes and teeth checked.  Stay up to date on all vaccines. This information is not intended to replace advice given to you by your health care provider. Make sure you discuss any questions you have with your health care provider. Document Revised: 04/29/2018 Document Reviewed: 04/29/2018 Elsevier Patient Education  2020 [employer].

## 2019-08-03 ENCOUNTER — Other Ambulatory Visit: Payer: Self-pay | Admitting: Family Medicine

## 2019-08-03 DIAGNOSIS — D539 Nutritional anemia, unspecified: Secondary | ICD-10-CM

## 2019-08-03 DIAGNOSIS — I10 Essential (primary) hypertension: Secondary | ICD-10-CM

## 2019-08-03 NOTE — Progress Notes (Signed)
Reviewed encounter and agree with documentation and plan as outlined

## 2019-08-08 ENCOUNTER — Encounter: Payer: Self-pay | Admitting: Family Medicine

## 2019-08-08 ENCOUNTER — Other Ambulatory Visit: Payer: Self-pay

## 2019-08-08 ENCOUNTER — Ambulatory Visit: Payer: Medicare Other | Admitting: Family Medicine

## 2019-08-08 DIAGNOSIS — M1712 Unilateral primary osteoarthritis, left knee: Secondary | ICD-10-CM

## 2019-08-08 NOTE — Patient Instructions (Signed)
Good to see you Please continue the salve  Please continue to stay active   Please send me a message in MyChart with any questions or updates.  Please see me back when your pain gets worse so we can do an injection.   --Dr. Raeford Razor

## 2019-08-08 NOTE — Progress Notes (Signed)
Nathan Goodwin - 86 y.o. male MRN [id number]  Date of birth: [birth year]  SUBJECTIVE:  Including CC & ROS.  Chief Complaint  Patient presents with  . Follow-up    follow up for left knee    Nathan Goodwin is a 86 y.o. male that is following up for his left knee pain.  Denies any significant problems in the interim.  He has occasional achy pain in the inside of his knee.  He uses his walker intermittently.  Uses his cane to help with ambulation.   Review of Systems See HPI   HISTORY: Past Medical, Surgical, Social, and Family History Reviewed & Updated per EMR.   Pertinent Historical Findings include:  Past Medical History:  Diagnosis Date  . Allergy   . Arthritis   . Benign essential HTN 03/09/2012  . Chicken pox   . Colon cancer ([place])    colon ca dx 3/11  . Diabetes mellitus   . 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 Hyperlipidemia   . Hypertension   . Prostate CA [facility])    prostate ca dx 1999  . Stage II carcinoma of colon ([place]) 03/09/2012   6.5 cm  Grade II adenoca transverse colon 25 nodes negative resected 08/14/09  . Tubular adenoma of colon 03/09/2012   Right hemicolectomy 05/14/10  . 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  Family History  Problem Relation Age of Onset  . Arthritis Mother   . Depression Mother   . Diabetes Mother   . Hypertension Mother   . Mental illness Mother   . Diabetes Father   . Hyperlipidemia Father   . Diabetes Brother   . Heart attack Brother   . Hyperlipidemia Brother   . Hypertension Brother   . Hyperlipidemia Daughter   . Diabetes Brother   . Heart attack Brother   . Hyperlipidemia Brother   . Hypertension Brother   . Hyperlipidemia Brother   . Hypertension Brother   . Diabetes Brother     Social History   Socioeconomic History  . Marital status: Married    Spouse name: Not on file  . Number of children: Not on file  .  Years of education: Not on file  . Highest education level: Not on file  Occupational History  . Not on file  Tobacco Use  . Smoking status: Former Research scientist (life sciences)  . Smokeless tobacco: Never Used  Substance and Sexual Activity  . Alcohol use: Not on file  . Drug use: Not on file  . Sexual activity: Not on file  Other Topics Concern  . Not on file  Social History Narrative  . Not on file   Social Determinants of Health   Financial Resource Strain: Low Risk   . Difficulty of Paying Living Expenses: Not hard at all  Food Insecurity: No Food Insecurity  . Worried About Charity fundraiser in the Last Year: Never true  . Ran Out of Food in the Last Year: Never true  Transportation Needs: No Transportation Needs  . Lack of Transportation (Medical): No  . Lack of Transportation (Non-Medical): No  Physical Activity:   . Days of Exercise per Week:   . Minutes of Exercise per Session:   Stress:   . Feeling of Stress :   Social Connections:   . Frequency of Communication with Friends and Family:   . Frequency of  Social Gatherings with Friends and Family:   . Attends Religious Services:   . Active Member of Clubs or Organizations:   . Attends Archivist Meetings:   Marland Kitchen Marital Status:   Intimate Partner Violence:   . Fear of Current or Ex-Partner:   . Emotionally Abused:   Marland Kitchen Physically Abused:   . Sexually Abused:      PHYSICAL EXAM:  VS: BP (!) 154/67   Pulse (!) 51   Ht 5\' 5"  (1.651 m)   Wt 150 lb (68 kg)   BMI 24.96 kg/m  Physical Exam Gen: NAD, alert, cooperative with exam, well-appearing MSK:  Left knee: No effusion. Instability with valgus and varus stress testing. Negative McMurray's test. Normal strength resistance. Neurovascularly intact     ASSESSMENT & PLAN:   OA (osteoarthritis) of knee Has been doing well as of late. -Counseled on home exercise therapy and supportive care. -Could consider injection as needed going forward.

## 2019-08-08 NOTE — Assessment & Plan Note (Signed)
Has been doing well as of late. -Counseled on home exercise therapy and supportive care. -Could consider injection as needed going forward.

## 2019-08-10 ENCOUNTER — Other Ambulatory Visit: Payer: Self-pay | Admitting: Family Medicine

## 2019-08-10 DIAGNOSIS — E1122 Type 2 diabetes mellitus with diabetic chronic kidney disease: Secondary | ICD-10-CM

## 2019-09-19 ENCOUNTER — Other Ambulatory Visit: Payer: Self-pay | Admitting: Family Medicine

## 2019-09-19 DIAGNOSIS — M1712 Unilateral primary osteoarthritis, left knee: Secondary | ICD-10-CM

## 2019-09-19 NOTE — Telephone Encounter (Signed)
Last OV 07/04/19 Last fill 03/15/19  #100g/1

## 2019-11-01 ENCOUNTER — Other Ambulatory Visit: Payer: Self-pay | Admitting: Family Medicine

## 2019-11-01 DIAGNOSIS — I10 Essential (primary) hypertension: Secondary | ICD-10-CM

## 2019-11-29 ENCOUNTER — Telehealth: Payer: Self-pay | Admitting: Family Medicine

## 2019-11-29 NOTE — Telephone Encounter (Signed)
Pt's daughter called wishes to know if patient can get his cortisone injection soon or is it too soon.--  ---Forwarding request to provider to confirm its okay to schedule appt for injection.  --glh

## 2019-12-13 ENCOUNTER — Ambulatory Visit (INDEPENDENT_AMBULATORY_CARE_PROVIDER_SITE_OTHER): Payer: Medicare Other | Admitting: Family Medicine

## 2019-12-13 ENCOUNTER — Encounter: Payer: Self-pay | Admitting: Family Medicine

## 2019-12-13 ENCOUNTER — Ambulatory Visit: Payer: Self-pay

## 2019-12-13 ENCOUNTER — Other Ambulatory Visit: Payer: Self-pay

## 2019-12-13 VITALS — BP 125/64 | HR 47 | Ht 65.0 in | Wt 145.0 lb

## 2019-12-13 DIAGNOSIS — M1712 Unilateral primary osteoarthritis, left knee: Secondary | ICD-10-CM

## 2019-12-13 MED ORDER — PENNSAID 2 % EX SOLN: 1.0000 "application " | Freq: Two times a day (BID) | CUTANEOUS | 3 refills | Status: DC

## 2019-12-13 MED ORDER — TRIAMCINOLONE ACETONIDE 40 MG/ML IJ SUSP
40.0000 mg | Freq: Once | INTRAMUSCULAR | Status: AC
  Administered 2019-12-13: 40 mg via INTRA_ARTICULAR

## 2019-12-13 NOTE — Patient Instructions (Signed)
Good to see you Please try ice  Please try the pennsaid   Please send me a message in MyChart with any questions or updates.  Please see me back in 2 months.   --Dr. Raeford Razor

## 2019-12-13 NOTE — Progress Notes (Signed)
Nathan Goodwin - 86 y.o. male MRN [id number]  Date of birth: 1931/02/10  SUBJECTIVE:  Including CC & ROS.  No chief complaint on file.   Nathan Goodwin is a 86 y.o. male that is presenting with worsening of his left knee pain.  We have tried braces and gel injections with limited improvement.  Denies any falls or injuries in the interim..   Review of Systems See HPI   HISTORY: Past Medical, Surgical, Social, and Family History Reviewed & Updated per EMR.   Pertinent Historical Findings include:  Past Medical History:  Diagnosis Date  . Allergy   . Arthritis   . Benign essential HTN 03/09/2012  . Chicken pox   . Colon cancer ([place])    colon ca dx 3/11  . Diabetes mellitus   . 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 Hyperlipidemia   . Hypertension   . Prostate CA [facility])    prostate ca dx 1999  . Stage II carcinoma of colon ([place]) 03/09/2012   6.5 cm  Grade II adenoca transverse colon 25 nodes negative resected 08/14/09  . Tubular adenoma of colon 03/09/2012   Right hemicolectomy 05/14/10  . 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  Family History  Problem Relation Age of Onset  . Arthritis Mother   . Depression Mother   . Diabetes Mother   . Hypertension Mother   . Mental illness Mother   . Diabetes Father   . Hyperlipidemia Father   . Diabetes Brother   . Heart attack Brother   . Hyperlipidemia Brother   . Hypertension Brother   . Hyperlipidemia Daughter   . Diabetes Brother   . Heart attack Brother   . Hyperlipidemia Brother   . Hypertension Brother   . Hyperlipidemia Brother   . Hypertension Brother   . Diabetes Brother     Social History   Socioeconomic History  . Marital status: Married    Spouse name: Not on file  . Number of children: Not on file  . Years of education: Not on file  . Highest education level: Not on file  Occupational History  . Not on  file  Tobacco Use  . Smoking status: Former Research scientist (life sciences)  . Smokeless tobacco: Never Used  Substance and Sexual Activity  . Alcohol use: Not on file  . Drug use: Not on file  . Sexual activity: Not on file  Other Topics Concern  . Not on file  Social History Narrative  . Not on file   Social Determinants of Health   Financial Resource Strain: Low Risk   . Difficulty of Paying Living Expenses: Not hard at all  Food Insecurity: No Food Insecurity  . Worried About Charity fundraiser in the Last Year: Never true  . Ran Out of Food in the Last Year: Never true  Transportation Needs: No Transportation Needs  . Lack of Transportation (Medical): No  . Lack of Transportation (Non-Medical): No  Physical Activity:   . Days of Exercise per Week:   . Minutes of Exercise per Session:   Stress:   . Feeling of Stress :   Social Connections:   . Frequency of Communication with Friends and Family:   . Frequency of Social Gatherings with Friends and Family:   . Attends Religious Services:   . Active Member of Clubs or Organizations:   . Attends Club  or Organization Meetings:   Marland Kitchen Marital Status:   Intimate Partner Violence:   . Fear of Current or Ex-Partner:   . Emotionally Abused:   Marland Kitchen Physically Abused:   . Sexually Abused:      PHYSICAL EXAM:  VS: There were no vitals taken for this visit. Physical Exam Gen: NAD, alert, cooperative with exam, well-appearing MSK:  Left knee: No obvious effusion. Valgus deformity. Tenderness palpation of the medial joint space. Neurovascularly intact   Aspiration/Injection Procedure Note Nathan Goodwin [birth year]  Procedure: Injection Indications: left knee pain   Procedure Details Consent: Risks of procedure as well as the alternatives and risks of each were explained to the (patient/caregiver).  Consent for procedure obtained. Time Out: Verified patient identification, verified procedure, site/side was marked, verified correct patient  position, special equipment/implants available, medications/allergies/relevent history reviewed, required imaging and test results available.  Performed.  The area was cleaned with iodine and alcohol swabs.    The left knee superior lateral suprapatellar pouch was injected using 1 cc's of 40 mg Kenalog and 4 cc's of 0.25% bupivacaine with with a 22 1 1/2" needle.  Ultrasound was used. Images were obtained in long views showing the injection.     A sterile dressing was applied.  Patient did tolerate procedure well.     ASSESSMENT & PLAN:   OA (osteoarthritis) of knee Acutely worsening of his knee pain.  Has known degenerative changes.  Walks with a cane to help with ambulation. -Counseled on home exercise therapy and supportive care. -Pennsaid and samples provided. -Injection.

## 2019-12-13 NOTE — Progress Notes (Signed)
Medication Samples have been provided to the patient.  Drug name: Pennsaid       Strength: 2%       Qty: 1 box  LOT: [id number]  Exp.Date: 06/2020  Dosing instructions: use a pea size amount and rub gently.  The patient has been instructed regarding the correct time, dose, and frequency of taking this medication, including desired effects and most common side effects.   Sherrie George, MA 8:48 AM 12/13/2019

## 2019-12-13 NOTE — Assessment & Plan Note (Signed)
Acutely worsening of his knee pain.  Has known degenerative changes.  Walks with a cane to help with ambulation. -Counseled on home exercise therapy and supportive care. -Pennsaid and samples provided. -Injection.

## 2019-12-22 ENCOUNTER — Telehealth: Payer: Self-pay | Admitting: Family Medicine

## 2019-12-22 DIAGNOSIS — J309 Allergic rhinitis, unspecified: Secondary | ICD-10-CM

## 2019-12-22 NOTE — Telephone Encounter (Signed)
Please advise message below  °

## 2019-12-22 NOTE — Telephone Encounter (Signed)
Daughter is calling and stated that patient is experiencing itchy eyes and running nose and wanted to see if Dr. [name] could call him something in to the pharmacy, please advise. CB is [phone]

## 2019-12-26 MED ORDER — MOMETASONE FUROATE 50 MCG/ACT NA SUSP: 2.0000 | Freq: Every day | NASAL | 2 refills | Status: AC

## 2019-12-26 NOTE — Telephone Encounter (Signed)
Rx sent in

## 2020-01-02 ENCOUNTER — Ambulatory Visit: Payer: Medicare Other | Admitting: Family Medicine

## 2020-01-02 ENCOUNTER — Other Ambulatory Visit: Payer: Self-pay

## 2020-01-02 ENCOUNTER — Encounter: Payer: Self-pay | Admitting: Family Medicine

## 2020-01-02 VITALS — BP 136/64 | HR 54 | Temp 97.5°F | Ht 65.0 in | Wt 141.8 lb

## 2020-01-02 DIAGNOSIS — N1832 Chronic kidney disease, stage 3b: Secondary | ICD-10-CM

## 2020-01-02 DIAGNOSIS — D539 Nutritional anemia, unspecified: Secondary | ICD-10-CM

## 2020-01-02 DIAGNOSIS — E113419 Type 2 diabetes mellitus with severe nonproliferative diabetic retinopathy with macular edema, unspecified eye: Secondary | ICD-10-CM | POA: Diagnosis not present

## 2020-01-02 DIAGNOSIS — J309 Allergic rhinitis, unspecified: Secondary | ICD-10-CM

## 2020-01-02 DIAGNOSIS — E7800 Pure hypercholesterolemia, unspecified: Secondary | ICD-10-CM

## 2020-01-02 DIAGNOSIS — E538 Deficiency of other specified B group vitamins: Secondary | ICD-10-CM | POA: Diagnosis not present

## 2020-01-02 DIAGNOSIS — I10 Essential (primary) hypertension: Secondary | ICD-10-CM

## 2020-01-02 DIAGNOSIS — E611 Iron deficiency: Secondary | ICD-10-CM

## 2020-01-02 DIAGNOSIS — H9193 Unspecified hearing loss, bilateral: Secondary | ICD-10-CM

## 2020-01-02 LAB — BASIC METABOLIC PANEL
BUN: 18 mg/dL (ref 6–23)
CO2: 26 mEq/L (ref 19–32)
Calcium: 8.8 mg/dL (ref 8.4–10.5)
Chloride: 105 mEq/L (ref 96–112)
Creatinine, Ser: 1.64 mg/dL — ABNORMAL HIGH (ref 0.40–1.50)
GFR: 48.36 mL/min — ABNORMAL LOW (ref >60.00)
Glucose, Bld: 99 mg/dL (ref 70–99)
Potassium: 4 mEq/L (ref 3.5–5.1)
Sodium: 139 mEq/L (ref 135–145)

## 2020-01-02 LAB — VITAMIN B12: Vitamin B-12: 1019 pg/mL — ABNORMAL HIGH (ref 211–911)

## 2020-01-02 LAB — IRON,TIBC AND FERRITIN PANEL
%SAT: 21 % (calc) (ref 20–48)
Ferritin: 303 ng/mL (ref 24–380)
Iron: 59 ug/dL (ref 50–180)
TIBC: 277 mcg/dL (calc) (ref 250–425)

## 2020-01-02 LAB — CBC
HCT: 28.1 % — ABNORMAL LOW (ref 39.0–52.0)
Hemoglobin: 9.5 g/dL — ABNORMAL LOW (ref 13.0–17.0)
MCHC: 33.9 g/dL (ref 30.0–36.0)
MCV: 95.7 fl (ref 78.0–100.0)
Platelets: 234 10*3/uL (ref 150.0–400.0)
RBC: 2.93 Mil/uL — ABNORMAL LOW (ref 4.22–5.81)
RDW: 13.7 % (ref 11.5–15.5)
WBC: 5.4 10*3/uL (ref 4.0–10.5)

## 2020-01-02 LAB — HEMOGLOBIN A1C: Hgb A1c MFr Bld: 5.7 % (ref 4.6–6.5)

## 2020-01-02 LAB — LDL CHOLESTEROL, DIRECT: Direct LDL: 76 mg/dL

## 2020-01-02 NOTE — Progress Notes (Signed)
Established Patient Office Visit  Subjective:  Patient ID: Nathan Goodwin, male    DOB: Aug 28, 1930  Age: 86 y.o. MRN: [id number]  CC:  Chief Complaint  Patient presents with  . Follow-up    6 month follow up on labs,     HPI Nathan Goodwin presents for follow-up of diabetes, elevated cholesterol, hypertension, B12 and iron deficiencies.  Has been experiencing watery eyes and runny nose with some sneezing.  Pharmacy gave him Nasonex to try.  He has been having some difficulty hearing with his current hearing aids.  He has been using cerumen earwax drops.  Continues atorvastatin, metoprolol and pioglitazone for cholesterol, hypertension and diabetes.  Patient discontinued B12 and iron supplements last visit but never started the multivitamin with iron as directed.  Accompanied by his daughter today.  Past Medical History:  Diagnosis Date  . Allergy   . Arthritis   . Benign essential HTN 03/09/2012  . Chicken pox   . Colon cancer ([place])    colon ca dx 3/11  . Diabetes mellitus   . 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 Hyperlipidemia   . Hypertension   . Prostate CA [facility])    prostate ca dx 1999  . Stage II carcinoma of colon ([place]) 03/09/2012   6.5 cm  Grade II adenoca transverse colon 25 nodes negative resected 08/14/09  . Tubular adenoma of colon 03/09/2012   Right hemicolectomy 05/14/10  . 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  Family History  Problem Relation Age of Onset  . Arthritis Mother   . Depression Mother   . Diabetes Mother   . Hypertension Mother   . Mental illness Mother   . Diabetes Father   . Hyperlipidemia Father   . Diabetes Brother   . Heart attack Brother   . Hyperlipidemia Brother   . Hypertension Brother   . Hyperlipidemia Daughter   . Diabetes Brother   . Heart attack Brother   . Hyperlipidemia Brother   . Hypertension Brother   . Hyperlipidemia  Brother   . Hypertension Brother   . Diabetes Brother     Social History   Socioeconomic History  . Marital status: Married    Spouse name: Not on file  . Number of children: Not on file  . Years of education: Not on file  . Highest education level: Not on file  Occupational History  . Not on file  Tobacco Use  . Smoking status: Former Research scientist (life sciences)  . Smokeless tobacco: Never Used  Substance and Sexual Activity  . Alcohol use: Not on file  . Drug use: Not on file  . Sexual activity: Not on file  Other Topics Concern  . Not on file  Social History Narrative  . Not on file   Social Determinants of Health   Financial Resource Strain: Low Risk   . Difficulty of Paying Living Expenses: Not hard at all  Food Insecurity: No Food Insecurity  . Worried About Charity fundraiser in the Last Year: Never true  . Ran Out of Food in the Last Year: Never true  Transportation Needs: No Transportation Needs  . Lack of Transportation (Medical): No  . Lack of Transportation (Non-Medical): No  Physical Activity:   . Days of Exercise per Week:   . Minutes of Exercise per Session:   Stress:   . Feeling of Stress :  Social Connections:   . Frequency of Communication with Friends and Family:   . Frequency of Social Gatherings with Friends and Family:   . Attends Religious Services:   . Active Member of Clubs or Organizations:   . Attends Archivist Meetings:   Marland Kitchen Marital Status:   Intimate Partner Violence:   . Fear of Current or Ex-Partner:   . Emotionally Abused:   Marland Kitchen Physically Abused:   . Sexually Abused:     Outpatient Medications Prior to Visit  Medication Sig Dispense Refill  . atorvastatin (LIPITOR) 20 MG tablet TAKE 1 TABLET(20 MG) BY MOUTH DAILY 90 tablet 1  . glucose blood (ONE TOUCH ULTRA TEST) test strip Use to test blood sugars 1-2 times daily. 100 each 3  . metoprolol succinate (TOPROL-XL) 50 MG 24 hr tablet TAKE 1 TABLET BY MOUTH EVERY DAY WITH OR IMMEDIATELY  FOLLOWING A MEAL 90 tablet 0  . mometasone (NASONEX) 50 MCG/ACT nasal spray Place 2 sprays into the nose daily. 17 g 2  . pioglitazone (ACTOS) 15 MG tablet TAKE 1 TABLET(15 MG) BY MOUTH DAILY 90 tablet 1  . alfuzosin (UROXATRAL) 10 MG 24 hr tablet TK 1 T PO QD (Patient not taking: Reported on 01/02/2020)  3  . b complex vitamins capsule Take 1 capsule by mouth daily. (Patient not taking: Reported on 01/02/2020) 100 capsule 1  . B-12 TR 2000 MCG TBCR TAKE 1 TABLET BY MOUTH EVERY DAY (Patient not taking: Reported on 01/02/2020) 100 tablet 1  . Diclofenac Sodium (PENNSAID) 2 % SOLN Place 1 application onto the skin 2 (two) times daily. (Patient not taking: Reported on 01/02/2020) 112 g 3  . FEROSUL 325 (65 Fe) MG tablet TAKE 1 TABLET BY MOUTH TWICE DAILY WITH A MEAL (Patient not taking: Reported on 01/02/2020) 60 tablet 3  . megestrol (MEGACE) 20 MG tablet TK 1 T PO  BID PRN (Patient not taking: Reported on 01/02/2020)  11  . Skin Protectants, Misc. (EUCERIN) cream Apply topically as needed for dry skin. (Patient not taking: Reported on 01/02/2020) 454 g 12  . triamcinolone ointment (KENALOG) 0.1 % Apply 1 application topically 2 (two) times daily. To affected areas (Patient not taking: Reported on 01/02/2020) 60 g 6   No facility-administered medications prior to visit.    No Known Allergies  ROS Review of Systems  Constitutional: Negative.  Negative for fatigue, fever and unexpected weight change.  HENT: Positive for congestion, postnasal drip and rhinorrhea.   Eyes: Positive for discharge, itching and visual disturbance. Negative for redness.  Respiratory: Negative for chest tightness, shortness of breath and wheezing.   Cardiovascular: Negative for chest pain and palpitations.  Gastrointestinal: Negative.   Endocrine: Negative for polyphagia and polyuria.  Genitourinary: Negative.   Musculoskeletal: Positive for gait problem. Negative for joint swelling.  Allergic/Immunologic: Negative for  immunocompromised state.  Neurological: Negative for light-headedness and headaches.  Hematological: Does not bruise/bleed easily.  Psychiatric/Behavioral: Negative.       Objective:    Physical Exam Vitals and nursing note reviewed.  Constitutional:      General: He is not in acute distress.    Appearance: Normal appearance. He is not ill-appearing, toxic-appearing or diaphoretic.  HENT:     Head: Normocephalic and atraumatic.     Right Ear: Tympanic membrane, ear canal and external ear normal. There is no impacted cerumen.     Left Ear: Tympanic membrane, ear canal and external ear normal. There is no impacted cerumen.  Nose: No congestion or rhinorrhea.     Mouth/Throat:     Mouth: Mucous membranes are moist.     Pharynx: Oropharynx is clear. No oropharyngeal exudate or posterior oropharyngeal erythema.  Eyes:     General: No scleral icterus.       Right eye: No discharge.        Left eye: No discharge.     Conjunctiva/sclera: Conjunctivae normal.  Cardiovascular:     Rate and Rhythm: Normal rate and regular rhythm.  Pulmonary:     Effort: Pulmonary effort is normal. No respiratory distress.     Breath sounds: No stridor. No wheezing or rhonchi.  Chest:     Chest wall: No tenderness.  Abdominal:     General: Bowel sounds are normal.  Musculoskeletal:     Cervical back: No rigidity or tenderness.  Lymphadenopathy:     Cervical: No cervical adenopathy.  Skin:    General: Skin is warm and dry.  Neurological:     Mental Status: He is alert and oriented to person, place, and time.  Psychiatric:        Behavior: Behavior normal.     BP 136/64   Pulse (!) 54   Temp (!) 97.5 F (36.4 C) (Tympanic)   Ht 5\' 5"  (1.651 m)   Wt 141 lb 12.8 oz (64.3 kg)   SpO2 97%   BMI 23.60 kg/m  Wt Readings from Last 3 Encounters:  01/02/20 141 lb 12.8 oz (64.3 kg)  12/13/19 145 lb (65.8 kg)  08/08/19 150 lb (68 kg)     Health Maintenance Due  Topic Date Due  . FOOT EXAM   Never done  . TETANUS/TDAP  Never done  . URINE MICROALBUMIN  03/27/2019  . INFLUENZA VACCINE  12/18/2019  . HEMOGLOBIN A1C  01/01/2020    There are no preventive care reminders to display for this patient.   Lab Results  Component Value Date   WBC 5.1 07/04/2019   HGB 10.2 (L) 07/04/2019   HCT 30.7 (L) 07/04/2019   MCV 97.6 07/04/2019   PLT 211.0 07/04/2019   Lab Results  Component Value Date   NA 136 07/04/2019   K 4.3 07/04/2019   CO2 27 07/04/2019   GLUCOSE 101 (H) 07/04/2019   BUN 20 07/04/2019   CREATININE 1.72 (H) 07/04/2019   BILITOT 0.5 12/24/2017   ALKPHOS 39 12/24/2017   AST 14 12/24/2017   ALT 9 12/24/2017   PROT 7.0 12/24/2017   ALBUMIN 3.9 12/24/2017   CALCIUM 9.1 07/04/2019   ANIONGAP 7 02/07/2019   GFR 45.82 (L) 07/04/2019   Lab Results  Component Value Date   CHOL 151 12/24/2017   Lab Results  Component Value Date   HDL 55.00 12/24/2017   Lab Results  Component Value Date   LDLCALC 64 12/24/2017   Lab Results  Component Value Date   TRIG 163.0 (H) 12/24/2017   Lab Results  Component Value Date   CHOLHDL 3 12/24/2017   Lab Results  Component Value Date   HGBA1C 5.4 07/04/2019      Assessment & Plan:   Problem List Items Addressed This Visit      Cardiovascular and Mediastinum   Benign essential HTN   Relevant Orders   Basic metabolic panel     Respiratory   Allergic rhinitis     Endocrine   Type 2 diabetes mellitus with severe nonproliferative retinopathy and macular edema, without long-term current use of insulin ([place]) - Primary  Relevant Orders   Basic metabolic panel   Hemoglobin A1c     Nervous and Auditory   Bilateral hearing loss   Relevant Orders   Ambulatory referral to Audiology     Genitourinary   CKD (chronic kidney disease), stage III   Relevant Orders   Basic metabolic panel     Other   Elevated LDL cholesterol level   Relevant Orders   LDL cholesterol, direct   Macrocytic anemia   Relevant  Orders   CBC   Vitamin B12   B12 deficiency   Low iron   Relevant Orders   Iron, TIBC and Ferritin Panel      No orders of the defined types were placed in this encounter.   Follow-up: Return in about 6 months (around 07/04/2020), or Multivitamin with iron for now. May need to add iron. We'll see and let you know..  Asked him to start the multivitamin with iron.  We will add to it has lab results dictate.  Continue all other medicines as directed.  Libby Maw, MD

## 2020-01-05 ENCOUNTER — Ambulatory Visit: Payer: Medicare Other | Attending: Audiologist | Admitting: Audiologist

## 2020-01-05 ENCOUNTER — Other Ambulatory Visit: Payer: Self-pay

## 2020-01-05 DIAGNOSIS — H906 Mixed conductive and sensorineural hearing loss, bilateral: Secondary | ICD-10-CM | POA: Diagnosis not present

## 2020-01-06 NOTE — Procedures (Signed)
Outpatient Audiology and [place], [place]  [address] [phone]  AUDIOLOGICAL  EVALUATION  NAME: Nathan Goodwin     DOB:   11-07-1930      MRN: [id number]                                                                                     DATE: 01/06/2020     REFERENT: Libby Maw, MD STATUS: Outpatient DIAGNOSIS: Mixed hearing loss, bilateral  History: Nathan Goodwin was seen for an audiological evaluation and hearing aid repair. Nathan Goodwin was accompanied to the appointment by his daughter.  Nathan Goodwin's daughter has power of attorney. Nathan Goodwin is receiving a hearing evaluation due to concerns for increased hearing loss. Nathan Goodwin has difficulty hearing in all listening environments. He wears Phonak Virto super powered hearing aids with brown tubing and earmolds. He purchased these aids from [facility] in 2018. He has not seen an audiologist or an ENT Physician in about two years. He says today that he cannot hear well with his hearing aids anymore. This increased difficulty began gradually. No pain or pressure reported in either ear but [name] is visibly congested. He says for the past week his eyes, ears, and nose have been running.  He has a positive history for noise exposure. He worked for approximately 10 years at [employer] for the [employer]. Nathan Goodwin is visually impaired himself. Found in previous records, Nathan Goodwin had reported he underwent right ear mastoid surgery in 1960. He remembers having surgery to an ear, but not which one and not why. He knows left ear has always been his better ear. Medical history positive for type 2 diabetes mellitus with severe nonproliferative retinopathy and macular edema and CKD (chronic kidney disease), stage III which are risk factors for hearing loss. No other relevant case history reported.   Evaluation:   Otoscopy showed surgical ear in his right ear and normal tympanic membrane in the left ear, tympanic membrane only  partially visible due to partial cerumen occlusion bilaterally    Tympanometry results were consistent with shallow middle ear function (As), bilaterally    Audiometric testing was completed using conventional audiometry with supraural and insert transducer. Speech Recognition Thresholds were consistent with pure tone average in the left ear only. Word Recognition was excellent at an elevated level in the left ear and bilaterally. In the right ear [name]'s SRT and WRS are poor and inconsistent with thresholds. Word recognition in the has declined in the right ear from 52% to 12% (compared to previous record from ENT). There is no indication of binaural interference when word recognition is presented bilaterally.   Results:  The test results were reviewed with Nathan Goodwin and his daughter. He was advised he is not hearing well for several reasons. The first is the tubing of his hearing aids have wax in them and they need to be cleaned. They were cleaned some today but we are not equipped for hearing aid maintenance here at [facility]. He needs to see an audiologist that works with hearing aids every 6 months for regular maintenance. Second, he has very limited speech understanding in his right ear. The  right ear is not interfering with the left, so to continue to give him spatial awareness due to his visual impairment using both ears aids is still recommended. Third, it seems his congestion may be causing a decrease in hearing for his good left ear. He needs to see a medical professional to help with this congestion and once it is resolved have his hearing retested to confirm that left ear hearing has returned to normal. Nathan Goodwin and his daughter reported understanding. With Nathan Goodwin and his daughter's consent I called [facility] and set Nathan Goodwin up with an appointment to clean the hearing aids and reprogram as needed.   Recommendations: 1. Nathan Goodwin's hearing aids are partially occluded with cerumen and in need of maintenance.  He would like to start seeing a new provider and not return to where he purchased the aids. An appointment was scheduled on 01/20/20 at 1:00 pm with [facility]'s Dr. Sallye Lat for a hearing aid check. This time works for his daughter. At this appointment Nathan Goodwin's aids can be cleaned and he can be counseled on how to use his volume control. This is a convenient location for him to routinely see an audiologist for follow up care.  2. Referral to ENT Physician necessary due to the following: congestion and new conductive component to the hearing loss in Nathan Goodwin's left ear, a surgical right ear, and a significant drop in word recognition compared to his previous evaluations in the right ear.  a. Dr. [name] please send a referral to [place]. Lucia Gaskins, MD on [place].     Nathan Goodwin  Audiologist, Au.D., CCC-A 01/06/2020  8:02 AM  Cc: Libby Maw, MD

## 2020-01-30 ENCOUNTER — Other Ambulatory Visit: Payer: Self-pay | Admitting: Family Medicine

## 2020-01-30 DIAGNOSIS — I10 Essential (primary) hypertension: Secondary | ICD-10-CM

## 2020-02-13 ENCOUNTER — Encounter: Payer: Self-pay | Admitting: Family Medicine

## 2020-02-13 ENCOUNTER — Ambulatory Visit: Payer: Self-pay

## 2020-02-13 ENCOUNTER — Ambulatory Visit: Payer: Medicare Other | Admitting: Family Medicine

## 2020-02-13 ENCOUNTER — Other Ambulatory Visit: Payer: Self-pay

## 2020-02-13 DIAGNOSIS — M1712 Unilateral primary osteoarthritis, left knee: Secondary | ICD-10-CM

## 2020-02-13 MED ORDER — TRIAMCINOLONE ACETONIDE 40 MG/ML IJ SUSP
40.0000 mg | Freq: Once | INTRAMUSCULAR | Status: AC
  Administered 2020-02-13: 40 mg via INTRA_ARTICULAR

## 2020-02-13 MED ORDER — DICLOFENAC SODIUM 1 % EX GEL: 4.0000 g | Freq: Four times a day (QID) | CUTANEOUS | 11 refills | Status: AC

## 2020-02-13 NOTE — Patient Instructions (Signed)
Good to see you Sorry about your loss  Please try ice as needed   Please send me a message in MyChart with any questions or updates.  Please see me back in 8 weeks.   --Dr. Raeford Razor

## 2020-02-13 NOTE — Progress Notes (Signed)
Nathan Goodwin - 86 y.o. male MRN [id number]  Date of birth: Apr 11, 1931  SUBJECTIVE:  Including CC & ROS.  Chief Complaint  Patient presents with  . Follow-up    left knee    Nathan Goodwin is a 86 y.o. male that is presenting with acute worsening of left knee pain.  Denies any falls or injuries in the interim.  Pain is occurring over the medial joint space.  He does get improvement with the injections..   Review of Systems See HPI   HISTORY: Past Medical, Surgical, Social, and Family History Reviewed & Updated per EMR.   Pertinent Historical Findings include:  Past Medical History:  Diagnosis Date  . Allergy   . Arthritis   . Benign essential HTN 03/09/2012  . Chicken pox   . Colon cancer ([place])    colon ca dx 3/11  . Diabetes mellitus   . 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 Hyperlipidemia   . Hypertension   . Prostate CA [facility])    prostate ca dx 1999  . Stage II carcinoma of colon ([place]) 03/09/2012   6.5 cm  Grade II adenoca transverse colon 25 nodes negative resected 08/14/09  . Tubular adenoma of colon 03/09/2012   Right hemicolectomy 05/14/10  . 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  Family History  Problem Relation Age of Onset  . Arthritis Mother   . Depression Mother   . Diabetes Mother   . Hypertension Mother   . Mental illness Mother   . Diabetes Father   . Hyperlipidemia Father   . Diabetes Brother   . Heart attack Brother   . Hyperlipidemia Brother   . Hypertension Brother   . Hyperlipidemia Daughter   . Diabetes Brother   . Heart attack Brother   . Hyperlipidemia Brother   . Hypertension Brother   . Hyperlipidemia Brother   . Hypertension Brother   . Diabetes Brother     Social History   Socioeconomic History  . Marital status: Married    Spouse name: Not on file  . Number of children: Not on file  . Years of education: Not on file  .  Highest education level: Not on file  Occupational History  . Not on file  Tobacco Use  . Smoking status: Former Research scientist (life sciences)  . Smokeless tobacco: Never Used  Substance and Sexual Activity  . Alcohol use: Not on file  . Drug use: Not on file  . Sexual activity: Not on file  Other Topics Concern  . Not on file  Social History Narrative  . Not on file   Social Determinants of Health   Financial Resource Strain: Low Risk   . Difficulty of Paying Living Expenses: Not hard at all  Food Insecurity: No Food Insecurity  . Worried About Charity fundraiser in the Last Year: Never true  . Ran Out of Food in the Last Year: Never true  Transportation Needs: No Transportation Needs  . Lack of Transportation (Medical): No  . Lack of Transportation (Non-Medical): No  Physical Activity:   . Days of Exercise per Week: Not on file  . Minutes of Exercise per Session: Not on file  Stress:   . Feeling of Stress : Not on file  Social Connections:   . Frequency of Communication with Friends and Family: Not on file  . Frequency of Social Gatherings  with Friends and Family: Not on file  . Attends Religious Services: Not on file  . Active Member of Clubs or Organizations: Not on file  . Attends Archivist Meetings: Not on file  . Marital Status: Not on file  Intimate Partner Violence:   . Fear of Current or Ex-Partner: Not on file  . Emotionally Abused: Not on file  . Physically Abused: Not on file  . Sexually Abused: Not on file     PHYSICAL EXAM:  VS: BP (!) 146/73   Pulse (!) 57   Ht 5\' 5"  (1.651 m)   Wt 145 lb (65.8 kg)   BMI 24.13 kg/m  Physical Exam Gen: NAD, alert, cooperative with exam, well-appearing MSK:  Left knee:  No obvious effusion. Tenderness to palpation over the medial joint space. Normal range of motion. Normal strength resistance. Neurovascularly intact   Aspiration/Injection Procedure Note Nathan Goodwin [birth year]  Procedure: Injection Indications:  Left knee pain  Procedure Details Consent: Risks of procedure as well as the alternatives and risks of each were explained to the (patient/caregiver).  Consent for procedure obtained. Time Out: Verified patient identification, verified procedure, site/side was marked, verified correct patient position, special equipment/implants available, medications/allergies/relevent history reviewed, required imaging and test results available.  Performed.  The area was cleaned with iodine and alcohol swabs.    The Left knee superior lateral suprapatellar pouch was injected using 1 cc's of 40 mg Kenalog and 4 cc's of 0.25% bupivacaine with a 22 1 1/2" needle.  Ultrasound was used. Images were obtained in long views showing the injection.     A sterile dressing was applied.  Patient did tolerate procedure well.     ASSESSMENT & PLAN:   OA (osteoarthritis) of knee Acute exacerbation of his underlying degenerative changes.  He is wearing out of his sole on the right foot when compared to the left.  -Counseled on supportive care. -Refilled Voltaren gel. -Injection. -Provided Green sport insoles. -Follow-up in 8 weeks.

## 2020-02-13 NOTE — Addendum Note (Signed)
Addended by: Sherrie George F on: 02/13/2020 10:14 AM   Modules accepted: Orders

## 2020-02-13 NOTE — Assessment & Plan Note (Addendum)
Acute exacerbation of his underlying degenerative changes.  He is wearing out of his sole on the right foot when compared to the left.  -Counseled on supportive care. -Refilled Voltaren gel. -Injection. -Provided Green sport insoles. -Follow-up in 8 weeks.

## 2020-03-16 ENCOUNTER — Other Ambulatory Visit: Payer: Self-pay | Admitting: Family Medicine

## 2020-03-16 DIAGNOSIS — N182 Chronic kidney disease, stage 2 (mild): Secondary | ICD-10-CM

## 2020-03-16 DIAGNOSIS — E1122 Type 2 diabetes mellitus with diabetic chronic kidney disease: Secondary | ICD-10-CM

## 2020-04-09 ENCOUNTER — Encounter: Payer: Self-pay | Admitting: Family Medicine

## 2020-04-09 ENCOUNTER — Ambulatory Visit: Payer: Medicare Other | Admitting: Family Medicine

## 2020-04-09 ENCOUNTER — Other Ambulatory Visit: Payer: Self-pay

## 2020-04-09 ENCOUNTER — Ambulatory Visit: Payer: Self-pay

## 2020-04-09 DIAGNOSIS — M1712 Unilateral primary osteoarthritis, left knee: Secondary | ICD-10-CM

## 2020-04-09 MED ORDER — TRIAMCINOLONE ACETONIDE 40 MG/ML IJ SUSP
40.0000 mg | Freq: Once | INTRAMUSCULAR | Status: AC
Start: 2020-04-09 — End: 2020-04-09
  Administered 2020-04-09: 40 mg via INTRA_ARTICULAR

## 2020-04-09 NOTE — Assessment & Plan Note (Addendum)
Acute on chronic in nature.  -Counseled on home exercise therapy and supportive care. -Injection. -Counseled on brace

## 2020-04-09 NOTE — Progress Notes (Signed)
Nathan Goodwin - 86 y.o. male MRN [id number]  Date of birth: 06/23/1930  SUBJECTIVE:  Including CC & ROS.  Chief Complaint  Patient presents with  . Follow-up    left knee    Nathan Goodwin is a 86 y.o. male that is presenting with acute worsening of his left knee pain.  Denies any falls.  Has been using the Voltaren cream.  Stays fairly active on the pain.  Having moderate pain today..   Review of Systems See HPI   HISTORY: Past Medical, Surgical, Social, and Family History Reviewed & Updated per EMR.   Pertinent Historical Findings include:  Past Medical History:  Diagnosis Date  . Allergy   . Arthritis   . Benign essential HTN 03/09/2012  . Chicken pox   . Colon cancer ([place])    colon ca dx 3/11  . Diabetes mellitus   . 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 Hyperlipidemia   . Hypertension   . Prostate CA [facility])    prostate ca dx 1999  . Stage II carcinoma of colon ([place]) 03/09/2012   6.5 cm  Grade II adenoca transverse colon 25 nodes negative resected 08/14/09  . Tubular adenoma of colon 03/09/2012   Right hemicolectomy 05/14/10  . 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  Family History  Problem Relation Age of Onset  . Arthritis Mother   . Depression Mother   . Diabetes Mother   . Hypertension Mother   . Mental illness Mother   . Diabetes Father   . Hyperlipidemia Father   . Diabetes Brother   . Heart attack Brother   . Hyperlipidemia Brother   . Hypertension Brother   . Hyperlipidemia Daughter   . Diabetes Brother   . Heart attack Brother   . Hyperlipidemia Brother   . Hypertension Brother   . Hyperlipidemia Brother   . Hypertension Brother   . Diabetes Brother     Social History   Socioeconomic History  . Marital status: Married    Spouse name: Not on file  . Number of children: Not on file  . Years of education: Not on file  . Highest education  level: Not on file  Occupational History  . Not on file  Tobacco Use  . Smoking status: Former Research scientist (life sciences)  . Smokeless tobacco: Never Used  Substance and Sexual Activity  . Alcohol use: Not on file  . Drug use: Not on file  . Sexual activity: Not on file  Other Topics Concern  . Not on file  Social History Narrative  . Not on file   Social Determinants of Health   Financial Resource Strain: Low Risk   . Difficulty of Paying Living Expenses: Not hard at all  Food Insecurity: No Food Insecurity  . Worried About Charity fundraiser in the Last Year: Never true  . Ran Out of Food in the Last Year: Never true  Transportation Needs: No Transportation Needs  . Lack of Transportation (Medical): No  . Lack of Transportation (Non-Medical): No  Physical Activity:   . Days of Exercise per Week: Not on file  . Minutes of Exercise per Session: Not on file  Stress:   . Feeling of Stress : Not on file  Social Connections:   . Frequency of Communication with Friends and Family: Not on file  . Frequency of Social Gatherings with Friends  and Family: Not on file  . Attends Religious Services: Not on file  . Active Member of Clubs or Organizations: Not on file  . Attends Archivist Meetings: Not on file  . Marital Status: Not on file  Intimate Partner Violence:   . Fear of Current or Ex-Partner: Not on file  . Emotionally Abused: Not on file  . Physically Abused: Not on file  . Sexually Abused: Not on file     PHYSICAL EXAM:  VS: Ht 5\' 5"  (1.651 m)   Wt 145 lb (65.8 kg)   BMI 24.13 kg/m  Physical Exam Gen: NAD, alert, cooperative with exam, well-appearing MSK:  Left knee: Mild effusion. Tenderness palpation of the medial joint space. Normal range of motion. Neurovascularly intact   Aspiration/Injection Procedure Note Nathan Goodwin [birth year]  Procedure: Injection Indications: Left knee pain  Procedure Details Consent: Risks of procedure as well as the alternatives  and risks of each were explained to the (patient/caregiver).  Consent for procedure obtained. Time Out: Verified patient identification, verified procedure, site/side was marked, verified correct patient position, special equipment/implants available, medications/allergies/relevent history reviewed, required imaging and test results available.  Performed.  The area was cleaned with iodine and alcohol swabs.    The left knee superior lateral suprapatellar pouch was injected using 1 cc's of 40 mg Kenalog and 4 cc's of 0.25% bupivacaine with a 22 1 1/2" needle.  Ultrasound was used. Images were obtained in long views showing the injection.     A sterile dressing was applied.  Patient did tolerate procedure well.     ASSESSMENT & PLAN:   OA (osteoarthritis) of knee Acute on chronic in nature.  -Counseled on home exercise therapy and supportive care. -Injection. -Counseled on brace

## 2020-04-09 NOTE — Patient Instructions (Signed)
Good to see you Happy early Birthday.  Please try ice as needed   Please send me a message in MyChart with any questions or updates.  Please see me back in 8 weeks.   --Dr. Raeford Razor

## 2020-04-09 NOTE — Addendum Note (Signed)
Addended by: Sherrie George F on: 04/09/2020 10:06 AM   Modules accepted: Orders

## 2020-04-29 ENCOUNTER — Other Ambulatory Visit: Payer: Self-pay | Admitting: Family Medicine

## 2020-04-29 DIAGNOSIS — E7800 Pure hypercholesterolemia, unspecified: Secondary | ICD-10-CM

## 2020-04-29 DIAGNOSIS — I10 Essential (primary) hypertension: Secondary | ICD-10-CM

## 2020-06-08 ENCOUNTER — Ambulatory Visit: Payer: Medicare Other | Admitting: Family Medicine

## 2020-06-12 ENCOUNTER — Other Ambulatory Visit: Payer: Self-pay

## 2020-06-12 ENCOUNTER — Ambulatory Visit: Payer: Medicare Other | Admitting: Family Medicine

## 2020-06-12 ENCOUNTER — Ambulatory Visit: Payer: Self-pay

## 2020-06-12 VITALS — BP 120/60 | Ht 65.0 in | Wt 145.0 lb

## 2020-06-12 DIAGNOSIS — M1712 Unilateral primary osteoarthritis, left knee: Secondary | ICD-10-CM

## 2020-06-12 MED ORDER — TRIAMCINOLONE ACETONIDE 40 MG/ML IJ SUSP
40.0000 mg | Freq: Once | INTRAMUSCULAR | Status: AC
Start: 2020-06-12 — End: 2020-06-12
  Administered 2020-06-12: 40 mg via INTRA_ARTICULAR

## 2020-06-12 NOTE — Assessment & Plan Note (Signed)
Acute exacerbation of underlying degenerative changes. -Counseled on home exercise therapy and supportive care. -Injection. -Could consider Zilretta.

## 2020-06-12 NOTE — Progress Notes (Signed)
Nathan Goodwin - 87 y.o. male MRN [id number]  Date of birth: 02-24-1931  SUBJECTIVE:  Including CC & ROS.  No chief complaint on file.   Nathan Goodwin is a 87 y.o. male that is presenting with acute worsening of his left knee pain.   Review of Systems See HPI   HISTORY: Past Medical, Surgical, Social, and Family History Reviewed & Updated per EMR.   Pertinent Historical Findings include:  Past Medical History:  Diagnosis Date  . Allergy   . Arthritis   . Benign essential HTN 03/09/2012  . Chicken pox   . Colon cancer ([place])    colon ca dx 3/11  . Diabetes mellitus   . 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 Hyperlipidemia   . Hypertension   . Prostate CA [facility])    prostate ca dx 1999  . Stage II carcinoma of colon ([place]) 03/09/2012   6.5 cm  Grade II adenoca transverse colon 25 nodes negative resected 08/14/09  . Tubular adenoma of colon 03/09/2012   Right hemicolectomy 05/14/10  . 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  Family History  Problem Relation Age of Onset  . Arthritis Mother   . Depression Mother   . Diabetes Mother   . Hypertension Mother   . Mental illness Mother   . Diabetes Father   . Hyperlipidemia Father   . Diabetes Brother   . Heart attack Brother   . Hyperlipidemia Brother   . Hypertension Brother   . Hyperlipidemia Daughter   . Diabetes Brother   . Heart attack Brother   . Hyperlipidemia Brother   . Hypertension Brother   . Hyperlipidemia Brother   . Hypertension Brother   . Diabetes Brother     Social History   Socioeconomic History  . Marital status: Married    Spouse name: Not on file  . Number of children: Not on file  . Years of education: Not on file  . Highest education level: Not on file  Occupational History  . Not on file  Tobacco Use  . Smoking status: Former Research scientist (life sciences)  . Smokeless tobacco: Never Used  Substance and Sexual  Activity  . Alcohol use: Not on file  . Drug use: Not on file  . Sexual activity: Not on file  Other Topics Concern  . Not on file  Social History Narrative  . Not on file   Social Determinants of Health   Financial Resource Strain: Low Risk   . Difficulty of Paying Living Expenses: Not hard at all  Food Insecurity: No Food Insecurity  . Worried About Charity fundraiser in the Last Year: Never true  . Ran Out of Food in the Last Year: Never true  Transportation Needs: No Transportation Needs  . Lack of Transportation (Medical): No  . Lack of Transportation (Non-Medical): No  Physical Activity: Not on file  Stress: Not on file  Social Connections: Not on file  Intimate Partner Violence: Not on file     PHYSICAL EXAM:  VS: BP 120/60   Ht 5\' 5"  (1.651 m)   Wt 145 lb (65.8 kg)   BMI 24.13 kg/m  Physical Exam Gen: NAD, alert, cooperative with exam, well-appearing MSK:  Left knee: Mild effusion. Normal range of motion. Tenderness palpation of the medial joint space. Neurovascular intact   Aspiration/Injection Procedure Note DONAVYN FECHER [birth year]  Procedure:  Injection Indications: Left knee pain  Procedure Details Consent: Risks of procedure as well as the alternatives and risks of each were explained to the (patient/caregiver).  Consent for procedure obtained. Time Out: Verified patient identification, verified procedure, site/side was marked, verified correct patient position, special equipment/implants available, medications/allergies/relevent history reviewed, required imaging and test results available.  Performed.  The area was cleaned with iodine and alcohol swabs.    The Left knee superior lateral suprapatellar pouch was injected using 5 cc of 1% lidocaine on a 22-gauge 1-1/2 inch needle.The syringe was switched and a mixture containing  1 cc's of 40 mg Kenalog and 4 cc's of 0.25% bupivacaine was injected.  Ultrasound was used. Images were obtained in long  views showing the injection.     A sterile dressing was applied.  Patient did tolerate procedure well.     ASSESSMENT & PLAN:   OA (osteoarthritis) of knee Acute exacerbation of underlying degenerative changes. -Counseled on home exercise therapy and supportive care. -Injection. -Could consider Zilretta.

## 2020-06-12 NOTE — Patient Instructions (Signed)
Good to see you Please try ice  We'll check on the other medicine for you  Please send me a message in MyChart with any questions or updates.  Please see me back in 8 weeks.   --Dr. Raeford Razor

## 2020-07-04 ENCOUNTER — Ambulatory Visit: Payer: Medicare Other | Admitting: Family Medicine

## 2020-07-28 ENCOUNTER — Other Ambulatory Visit: Payer: Self-pay | Admitting: Family

## 2020-07-28 DIAGNOSIS — I10 Essential (primary) hypertension: Secondary | ICD-10-CM

## 2020-08-10 ENCOUNTER — Ambulatory Visit: Payer: Medicare Other | Admitting: Family Medicine

## 2020-08-10 ENCOUNTER — Other Ambulatory Visit: Payer: Self-pay

## 2020-08-10 ENCOUNTER — Ambulatory Visit: Payer: Self-pay

## 2020-08-10 ENCOUNTER — Encounter: Payer: Self-pay | Admitting: Family Medicine

## 2020-08-10 DIAGNOSIS — M1712 Unilateral primary osteoarthritis, left knee: Secondary | ICD-10-CM | POA: Diagnosis not present

## 2020-08-10 MED ORDER — TRIAMCINOLONE ACETONIDE 40 MG/ML IJ SUSP
40.0000 mg | Freq: Once | INTRAMUSCULAR | Status: AC
  Administered 2020-08-10: 40 mg via INTRA_ARTICULAR

## 2020-08-10 NOTE — Addendum Note (Signed)
Addended by: Cresenciano Lick on: 08/10/2020 11:10 AM   Modules accepted: Orders

## 2020-08-10 NOTE — Patient Instructions (Signed)
Good to see you Please try ice as needed   Please send me a message in MyChart with any questions or updates.  Please see me back in 8 weeks.   --Dr. Raeford Razor

## 2020-08-10 NOTE — Assessment & Plan Note (Addendum)
Acute on chronic in nature.  Related to his degenerative changes -Counseled on home exercise therapy and supportive care - injection.

## 2020-08-10 NOTE — Progress Notes (Signed)
Nathan Goodwin - 87 y.o. male MRN [id number]  Date of birth: 29-Mar-1931  SUBJECTIVE:  Including CC & ROS.  No chief complaint on file.   Nathan Goodwin is a 87 y.o. male that is presenting with acute left knee pain.  It feels worse in the medial compartment.   Review of Systems See HPI   HISTORY: Past Medical, Surgical, Social, and Family History Reviewed & Updated per EMR.   Pertinent Historical Findings include:  Past Medical History:  Diagnosis Date  . Allergy   . Arthritis   . Benign essential HTN 03/09/2012  . Chicken pox   . Colon cancer ([place])    colon ca dx 3/11  . Diabetes mellitus   . 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 Hyperlipidemia   . Hypertension   . Prostate CA [facility])    prostate ca dx 1999  . Stage II carcinoma of colon ([place]) 03/09/2012   6.5 cm  Grade II adenoca transverse colon 25 nodes negative resected 08/14/09  . Tubular adenoma of colon 03/09/2012   Right hemicolectomy 05/14/10  . 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  Family History  Problem Relation Age of Onset  . Arthritis Mother   . Depression Mother   . Diabetes Mother   . Hypertension Mother   . Mental illness Mother   . Diabetes Father   . Hyperlipidemia Father   . Diabetes Brother   . Heart attack Brother   . Hyperlipidemia Brother   . Hypertension Brother   . Hyperlipidemia Daughter   . Diabetes Brother   . Heart attack Brother   . Hyperlipidemia Brother   . Hypertension Brother   . Hyperlipidemia Brother   . Hypertension Brother   . Diabetes Brother     Social History   Socioeconomic History  . Marital status: Married    Spouse name: Not on file  . Number of children: Not on file  . Years of education: Not on file  . Highest education level: Not on file  Occupational History  . Not on file  Tobacco Use  . Smoking status: Former Research scientist (life sciences)  . Smokeless tobacco: Never  Used  Substance and Sexual Activity  . Alcohol use: Not on file  . Drug use: Not on file  . Sexual activity: Not on file  Other Topics Concern  . Not on file  Social History Narrative  . Not on file   Social Determinants of Health   Financial Resource Strain: Not on file  Food Insecurity: Not on file  Transportation Needs: Not on file  Physical Activity: Not on file  Stress: Not on file  Social Connections: Not on file  Intimate Partner Violence: Not on file     PHYSICAL EXAM:  VS: BP 130/64 (BP Location: Right Arm, Patient Position: Sitting, Cuff Size: Normal)   Ht 5\' 5"  (1.651 m)   Wt 145 lb (65.8 kg)   BMI 24.13 kg/m  Physical Exam Gen: NAD, alert, cooperative with exam, well-appearing MSK:  Left knee: No obvious effusion. Normal range of motion. Tenderness palpation of the medial joint space. Neurovascular intact   Aspiration/Injection Procedure Note [name] May 05, 1931  Procedure: Injection Indications: Left knee pain  Procedure Details Consent: Risks of procedure as well as the alternatives and risks of each were explained to the (patient/caregiver).  Consent for procedure obtained. Time Out: Verified patient  identification, verified procedure, site/side was marked, verified correct patient position, special equipment/implants available, medications/allergies/relevent history reviewed, required imaging and test results available.  Performed.  The area was cleaned with iodine and alcohol swabs.    The left knee superior lateral suprapatellar pouch was injected using 1 cc's of 40 mg Kenalog and 4 cc's of 0.25% bupivacaine with a 22 1 1/2" needle.  Ultrasound was used. Images were obtained in long views showing the injection.     A sterile dressing was applied.  Patient did tolerate procedure well.     ASSESSMENT & PLAN:   OA (osteoarthritis) of knee Acute on chronic in nature.  Related to his degenerative changes -Counseled on home exercise  therapy and supportive care - injection.

## 2020-09-12 ENCOUNTER — Other Ambulatory Visit: Payer: Self-pay | Admitting: Family Medicine

## 2020-09-12 DIAGNOSIS — E1122 Type 2 diabetes mellitus with diabetic chronic kidney disease: Secondary | ICD-10-CM

## 2020-09-12 DIAGNOSIS — N182 Chronic kidney disease, stage 2 (mild): Secondary | ICD-10-CM

## 2020-10-05 ENCOUNTER — Ambulatory Visit: Payer: Medicare Other | Admitting: Family Medicine

## 2020-10-05 NOTE — Progress Notes (Deleted)
  Nathan Goodwin - 87 y.o. male MRN [id number]  Date of birth: Jan 18, 1931  SUBJECTIVE:  Including CC & ROS.  No chief complaint on file.   Nathan Goodwin is a 87 y.o. male that is  ***.  ***   Review of Systems See HPI   HISTORY: Past Medical, Surgical, Social, and Family History Reviewed & Updated per EMR.   Pertinent Historical Findings include:  Past Medical History:  Diagnosis Date  . Allergy   . Arthritis   . Benign essential HTN 03/09/2012  . Chicken pox   . Colon cancer ([place])    colon ca dx 3/11  . Diabetes mellitus   . 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 Hyperlipidemia   . Hypertension   . Prostate CA [facility])    prostate ca dx 1999  . Stage II carcinoma of colon ([place]) 03/09/2012   6.5 cm  Grade II adenoca transverse colon 25 nodes negative resected 08/14/09  . Tubular adenoma of colon 03/09/2012   Right hemicolectomy 05/14/10  . 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  Family History  Problem Relation Age of Onset  . Arthritis Mother   . Depression Mother   . Diabetes Mother   . Hypertension Mother   . Mental illness Mother   . Diabetes Father   . Hyperlipidemia Father   . Diabetes Brother   . Heart attack Brother   . Hyperlipidemia Brother   . Hypertension Brother   . Hyperlipidemia Daughter   . Diabetes Brother   . Heart attack Brother   . Hyperlipidemia Brother   . Hypertension Brother   . Hyperlipidemia Brother   . Hypertension Brother   . Diabetes Brother     Social History   Socioeconomic History  . Marital status: Married    Spouse name: Not on file  . Number of children: Not on file  . Years of education: Not on file  . Highest education level: Not on file  Occupational History  . Not on file  Tobacco Use  . Smoking status: Former Research scientist (life sciences)  . Smokeless tobacco: Never Used  Substance and Sexual Activity  . Alcohol use: Not on file  .  Drug use: Not on file  . Sexual activity: Not on file  Other Topics Concern  . Not on file  Social History Narrative  . Not on file   Social Determinants of Health   Financial Resource Strain: Not on file  Food Insecurity: Not on file  Transportation Needs: Not on file  Physical Activity: Not on file  Stress: Not on file  Social Connections: Not on file  Intimate Partner Violence: Not on file     PHYSICAL EXAM:  VS: There were no vitals taken for this visit. Physical Exam Gen: NAD, alert, cooperative with exam, well-appearing MSK:  ***      ASSESSMENT & PLAN:   No problem-specific Assessment & Plan notes found for this encounter.

## 2020-10-10 ENCOUNTER — Ambulatory Visit: Payer: Medicare Other | Admitting: Family Medicine

## 2020-10-10 ENCOUNTER — Other Ambulatory Visit: Payer: Self-pay

## 2020-10-10 ENCOUNTER — Ambulatory Visit: Payer: Self-pay

## 2020-10-10 DIAGNOSIS — M1712 Unilateral primary osteoarthritis, left knee: Secondary | ICD-10-CM

## 2020-10-10 MED ORDER — TRIAMCINOLONE ACETONIDE 40 MG/ML IJ SUSP
40.0000 mg | Freq: Once | INTRAMUSCULAR | Status: AC
  Administered 2020-10-10: 40 mg via INTRA_ARTICULAR

## 2020-10-10 NOTE — Progress Notes (Signed)
Nathan Goodwin - 87 y.o. male MRN [id number]  Date of birth: 01-23-1931  SUBJECTIVE:  Including CC & ROS.  No chief complaint on file.   Nathan Goodwin is a 87 y.o. male that is presenting with acute on chronic left knee pain.  Denies any injury or inciting event.  Pain similar to this previous experience.   Review of Systems See HPI   HISTORY: Past Medical, Surgical, Social, and Family History Reviewed & Updated per EMR.   Pertinent Historical Findings include:  Past Medical History:  Diagnosis Date  . Allergy   . Arthritis   . Benign essential HTN 03/09/2012  . Chicken pox   . Colon cancer ([place])    colon ca dx 3/11  . Diabetes mellitus   . 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 Hyperlipidemia   . Hypertension   . Prostate CA [facility])    prostate ca dx 1999  . Stage II carcinoma of colon ([place]) 03/09/2012   6.5 cm  Grade II adenoca transverse colon 25 nodes negative resected 08/14/09  . Tubular adenoma of colon 03/09/2012   Right hemicolectomy 05/14/10  . 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  Family History  Problem Relation Age of Onset  . Arthritis Mother   . Depression Mother   . Diabetes Mother   . Hypertension Mother   . Mental illness Mother   . Diabetes Father   . Hyperlipidemia Father   . Diabetes Brother   . Heart attack Brother   . Hyperlipidemia Brother   . Hypertension Brother   . Hyperlipidemia Daughter   . Diabetes Brother   . Heart attack Brother   . Hyperlipidemia Brother   . Hypertension Brother   . Hyperlipidemia Brother   . Hypertension Brother   . Diabetes Brother     Social History   Socioeconomic History  . Marital status: Married    Spouse name: Not on file  . Number of children: Not on file  . Years of education: Not on file  . Highest education level: Not on file  Occupational History  . Not on file  Tobacco Use  . Smoking  status: Former Research scientist (life sciences)  . Smokeless tobacco: Never Used  Substance and Sexual Activity  . Alcohol use: Not on file  . Drug use: Not on file  . Sexual activity: Not on file  Other Topics Concern  . Not on file  Social History Narrative  . Not on file   Social Determinants of Health   Financial Resource Strain: Not on file  Food Insecurity: Not on file  Transportation Needs: Not on file  Physical Activity: Not on file  Stress: Not on file  Social Connections: Not on file  Intimate Partner Violence: Not on file     PHYSICAL EXAM:  VS: BP 130/60   Ht 5\' 5"  (1.651 m)   Wt 140 lb (63.5 kg)   BMI 23.30 kg/m  Physical Exam Gen: NAD, alert, cooperative with exam, well-appearing    Aspiration/Injection Procedure Note Nathan Goodwin Jan 12, 1931  Procedure: Injection Indications: Left knee pain  Procedure Details Consent: Risks of procedure as well as the alternatives and risks of each were explained to the (patient/caregiver).  Consent for procedure obtained. Time Out: Verified patient identification, verified procedure, site/side was marked, verified correct patient position, special equipment/implants available, medications/allergies/relevent history reviewed, required imaging and test results  available.  Performed.  The area was cleaned with iodine and alcohol swabs.    The left knee superior lateral suprapatellar pouch was injected using 1 cc's of 40 mg Kenalog and 4 cc's of 0.25% bupivacaine with a 22 1 1/2" needle.  Ultrasound was used. Images were obtained in long views showing the injection.     A sterile dressing was applied.  Patient did tolerate procedure well.      ASSESSMENT & PLAN:   OA (osteoarthritis) of knee Acute on chronic in nature.  - counseled on home exercise therapy and supportive care - injection  - f/u in 2 months.

## 2020-10-10 NOTE — Patient Instructions (Signed)
Good to see you Please use ice as needed   Please send me a message in MyChart with any questions or updates.  Please see me back in 2 months.   --Dr. Sevan Mcbroom  

## 2020-10-10 NOTE — Assessment & Plan Note (Signed)
Acute on chronic in nature.  - counseled on home exercise therapy and supportive care - injection  - f/u in 2 months.

## 2020-10-19 NOTE — Telephone Encounter (Signed)
Patient has new PCP

## 2020-10-26 ENCOUNTER — Other Ambulatory Visit: Payer: Self-pay | Admitting: Family

## 2020-10-26 DIAGNOSIS — I10 Essential (primary) hypertension: Secondary | ICD-10-CM

## 2020-12-11 ENCOUNTER — Other Ambulatory Visit: Payer: Self-pay | Admitting: Family Medicine

## 2020-12-11 DIAGNOSIS — N182 Chronic kidney disease, stage 2 (mild): Secondary | ICD-10-CM

## 2020-12-11 DIAGNOSIS — E1122 Type 2 diabetes mellitus with diabetic chronic kidney disease: Secondary | ICD-10-CM

## 2021-01-16 ENCOUNTER — Other Ambulatory Visit: Payer: Self-pay | Admitting: Family Medicine

## 2021-01-16 DIAGNOSIS — E1122 Type 2 diabetes mellitus with diabetic chronic kidney disease: Secondary | ICD-10-CM

## 2021-02-06 ENCOUNTER — Ambulatory Visit: Payer: Self-pay

## 2021-02-06 ENCOUNTER — Ambulatory Visit: Payer: Medicare Other | Admitting: Family Medicine

## 2021-02-06 ENCOUNTER — Encounter: Payer: Self-pay | Admitting: Family Medicine

## 2021-02-06 ENCOUNTER — Other Ambulatory Visit: Payer: Self-pay

## 2021-02-06 VITALS — Ht 65.0 in | Wt 133.0 lb

## 2021-02-06 DIAGNOSIS — M1712 Unilateral primary osteoarthritis, left knee: Secondary | ICD-10-CM

## 2021-02-06 MED ORDER — TRIAMCINOLONE ACETONIDE 40 MG/ML IJ SUSP
40.0000 mg | Freq: Once | INTRAMUSCULAR | Status: AC
  Administered 2021-02-06: 40 mg via INTRA_ARTICULAR

## 2021-02-06 NOTE — Progress Notes (Signed)
Nathan Goodwin - 87 y.o. male MRN [id number]  Date of birth: [birth year]  SUBJECTIVE:  Including CC & ROS.  No chief complaint on file.   Nathan Goodwin is a 87 y.o. male that is presenting with acute on chronic left knee pain.  No injury or recent event..    Review of Systems See HPI   HISTORY: Past Medical, Surgical, Social, and Family History Reviewed & Updated per EMR.   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 No pertinent surgical history.  Family History  Problem Relation Age of Onset   Arthritis Mother    Depression Mother    Diabetes Mother    Hypertension Mother    Mental illness Mother    Diabetes Father    Hyperlipidemia Father    Diabetes Brother    Heart attack Brother    Hyperlipidemia Brother    Hypertension Brother    Hyperlipidemia Daughter    Diabetes Brother    Heart attack Brother    Hyperlipidemia Brother    Hypertension Brother    Hyperlipidemia Brother    Hypertension Brother    Diabetes Brother     Social History   Socioeconomic History   Marital status: Married    Spouse name: Not on file   Number of children: Not on file   Years of education: Not on file   Highest education level: Not on file  Occupational History   Not on file  Tobacco Use   Smoking status: Former   Smokeless tobacco: Never  Substance and Sexual Activity    Alcohol use: Not on file   Drug use: Not on file   Sexual activity: Not on file  Other Topics Concern   Not on file  Social History Narrative   Not on file   Social Determinants of Health   Financial Resource Strain: Not on file  Food Insecurity: Not on file  Transportation Needs: Not on file  Physical Activity: Not on file  Stress: Not on file  Social Connections: Not on file  Intimate Partner Violence: Not on file     PHYSICAL EXAM:  VS: Ht 5\' 5"  (1.651 m)   Wt 133 lb (60.3 kg)   BMI 22.13 kg/m  Physical Exam Gen: NAD, alert, cooperative with exam, well-appearing    Aspiration/Injection Procedure Note Nathan Goodwin [birth year]  Procedure: Injection Indications: Left knee pain  Procedure Details Consent: Risks of procedure as well as the alternatives and risks of each were explained to the (patient/caregiver).  Consent for procedure obtained. Time Out: Verified patient identification, verified procedure, site/side was marked, verified correct patient position, special equipment/implants available, medications/allergies/relevent history reviewed, required imaging and test results available.  Performed.  The area was cleaned with iodine and alcohol swabs.  The left knee superior lateral suprapatellar pouch was injected using 3 cc of 1% lidocaine on a 21-gauge 1-1/2 inch needle.  The syringe was switched to mixture containing 1 cc's of 40 mg Kenalog and 4 cc's of 0.25% bupivacaine was injected.  Ultrasound was used. Images were obtained in long views showing the injection.     A sterile dressing was applied.  Patient did tolerate procedure well.     ASSESSMENT & PLAN:   OA (osteoarthritis) of knee Acute on chronic in nature. -Counseled on home exercise therapy and supportive care. -Green sport insoles. -Injection today. -Can pursue Zilretta in the future.

## 2021-02-06 NOTE — Patient Instructions (Signed)
Good to see you Please use ice as needed   Please send me a message in MyChart with any questions or updates.  Please see me back in 2 months.   --Dr. Annelisa Ryback  

## 2021-02-06 NOTE — Assessment & Plan Note (Signed)
Acute on chronic in nature. -Counseled on home exercise therapy and supportive care. -Green sport insoles. -Injection today. -Can pursue Zilretta in the future.

## 2021-05-03 ENCOUNTER — Ambulatory Visit: Payer: Self-pay

## 2021-05-03 ENCOUNTER — Encounter: Payer: Self-pay | Admitting: Family Medicine

## 2021-05-03 ENCOUNTER — Ambulatory Visit: Payer: Medicare Other | Admitting: Family Medicine

## 2021-05-03 VITALS — BP 126/70 | Ht 65.0 in | Wt 133.0 lb

## 2021-05-03 DIAGNOSIS — M1712 Unilateral primary osteoarthritis, left knee: Secondary | ICD-10-CM | POA: Diagnosis not present

## 2021-05-03 NOTE — Assessment & Plan Note (Signed)
Acute on chronic in nature. -Counseled on home exercise therapy and supportive care. - zilretta injection.

## 2021-05-03 NOTE — Progress Notes (Signed)
Nathan Goodwin - 87 y.o. male MRN [id number]  Date of birth: 07/24/1930  SUBJECTIVE:  Including CC & ROS.  No chief complaint on file.   Nathan Goodwin is a 87 y.o. male that is presenting with acute exacerbation of his left knee pain.  The pain is similar to his previous episodes.   Review of Systems See HPI   HISTORY: Past Medical, Surgical, Social, and Family History Reviewed & Updated per EMR.   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 No pertinent surgical history.  Family History  Problem Relation Age of Onset   Arthritis Mother    Depression Mother    Diabetes Mother    Hypertension Mother    Mental illness Mother    Diabetes Father    Hyperlipidemia Father    Diabetes Brother    Heart attack Brother    Hyperlipidemia Brother    Hypertension Brother    Hyperlipidemia Daughter    Diabetes Brother    Heart attack Brother    Hyperlipidemia Brother    Hypertension Brother    Hyperlipidemia Brother    Hypertension Brother    Diabetes Brother     Social History   Socioeconomic History   Marital status: Widowed    Spouse name: Not on file   Number of children: Not on file   Years of education: Not on file   Highest education level: Not on file  Occupational History   Not on file  Tobacco Use   Smoking status: Former   Smokeless tobacco: Never   Substance and Sexual Activity   Alcohol use: Not on file   Drug use: Not on file   Sexual activity: Not on file  Other Topics Concern   Not on file  Social History Narrative   Not on file   Social Determinants of Health   Financial Resource Strain: Not on file  Food Insecurity: Not on file  Transportation Needs: Not on file  Physical Activity: Not on file  Stress: Not on file  Social Connections: Not on file  Intimate Partner Violence: Not on file     PHYSICAL EXAM:  VS: BP 126/70 (BP Location: Left Arm, Patient Position: Sitting)    Ht 5\' 5"  (1.651 m)    Wt 133 lb (60.3 kg)    BMI 22.13 kg/m  Physical Exam Gen: NAD, alert, cooperative with exam, well-appearing    Aspiration/Injection Procedure Note REID REGAS 1931-01-29  Procedure: Injection Indications: left knee pain   Procedure Details Consent: Risks of procedure as well as the alternatives and risks of each were explained to the (patient/caregiver).  Consent for procedure obtained. Time Out: Verified patient identification, verified procedure, site/side was marked, verified correct patient position, special equipment/implants available, medications/allergies/relevent history reviewed,  required imaging and test results available.  Performed.  The area was cleaned with iodine and alcohol swabs.    The left knee superior lateral suprapatellar pouch was injected using 3 cc of 1% lidocaine on a 25-gauge 1-1/2 inch needle.  The syringe was switched and a mixture containing 5 cc's of 32 mg Zilretta and 4 cc's of 0.25% bupivacaine was injected.  Ultrasound was used. Images were obtained in long views showing the injection.    A sterile dressing was applied.  Patient did tolerate procedure well.     ASSESSMENT & PLAN:   OA (osteoarthritis) of knee Acute on chronic in nature. -Counseled on home exercise therapy and supportive care. - zilretta injection.

## 2021-05-03 NOTE — Patient Instructions (Signed)
Good to see you ?Please use ice as needed  ?Please send me a message in MyChart with any questions or updates.  ?Please see me back in 3 months.  ? ?--Dr. Katelyne Galster ? ?

## 2021-08-01 ENCOUNTER — Encounter: Payer: Self-pay | Admitting: Family Medicine

## 2021-08-01 ENCOUNTER — Ambulatory Visit: Payer: Self-pay

## 2021-08-01 ENCOUNTER — Ambulatory Visit: Payer: Medicare Other | Admitting: Family Medicine

## 2021-08-01 VITALS — BP 130/60 | Ht 65.0 in | Wt 133.0 lb

## 2021-08-01 DIAGNOSIS — M1712 Unilateral primary osteoarthritis, left knee: Secondary | ICD-10-CM | POA: Diagnosis not present

## 2021-08-01 MED ORDER — TRIAMCINOLONE ACETONIDE 40 MG/ML IJ SUSP
40.0000 mg | Freq: Once | INTRAMUSCULAR | Status: AC
  Administered 2021-08-01: 40 mg via INTRA_ARTICULAR

## 2021-08-01 NOTE — Assessment & Plan Note (Signed)
Acute on chronic in nature.  Having pain that resembles his previous degenerative type pain.  He was asking about Celebrex but due to his kidney function we will hold off for now.  If his newer labs are reassuring then could consider it. ?-Counseled on home exercise therapy and supportive care. ?-Injection today. ?

## 2021-08-01 NOTE — Patient Instructions (Addendum)
Good to see you ?Please use ice as needed  ?Please check his kidney function before starting celebrex ?Please send me a message in MyChart with any questions or updates.  ?Please see me back in 3 months.  ? ?--Dr. Raeford Razor ? ?

## 2021-08-01 NOTE — Addendum Note (Signed)
Addended by: Cresenciano Lick on: 08/01/2021 11:30 AM ? ? Modules accepted: Orders ? ?

## 2021-08-01 NOTE — Progress Notes (Signed)
?  Nathan Goodwin - 88 y.o. male MRN [id number]  Date of birth: 04/28/1931 ? ?SUBJECTIVE:  Including CC & ROS.  ?No chief complaint on file. ? ? ?Nathan Goodwin is a 88 y.o. male that is presenting with acute on chronic left knee pain.  The pain is similar to his previous experiences.  He was able to try Celebrex recently that did help with his pain.. ? ? ?Review of Systems ?See HPI  ? ?HISTORY: Past Medical, Surgical, Social, and Family History Reviewed & Updated per EMR.   ?Pertinent Historical Findings include: ? ?Past Medical History:  ?Diagnosis Date  ? Allergy   ? Arthritis   ? Benign essential HTN 03/09/2012  ? Chicken pox   ? Colon cancer ([place])   ? colon ca dx 3/11  ? Diabetes mellitus   ? 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 Hyperlipidemia   ? Hypertension   ? Prostate CA [facility])   ? prostate ca dx 1999  ? Stage II carcinoma of colon ([place]) 03/09/2012  ? 6.5 cm  Grade II adenoca transverse colon 25 nodes negative resected 08/14/09  ? Tubular adenoma of colon 03/09/2012  ? Right hemicolectomy 05/14/10  ? 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 ? ?History reviewed. No pertinent surgical history. ? ? ?PHYSICAL EXAM:  ?VS: BP 130/60 (BP Location: Left Arm, Patient Position: Sitting)   Ht '5\' 5"'$  (1.651 m)   Wt 133 lb (60.3 kg)   BMI 22.13 kg/m?  ?Physical Exam ?Gen: NAD, alert, cooperative with exam, well-appearing ?MSK:  ?Neurovascularly intact   ? ? ?Aspiration/Injection Procedure Note ?Wardell Honour ?1930-06-05 ? ?Procedure: Injection ?Indications: Left knee pain ? ?Procedure Details ?Consent: Risks of procedure as well as the alternatives and risks of each were explained to the (patient/caregiver).  Consent for procedure obtained. ?Time Out: Verified patient identification, verified procedure, site/side was marked, verified correct patient position, special equipment/implants available, medications/allergies/relevent history reviewed, required imaging  and test results available.  Performed.  The area was cleaned with iodine and alcohol swabs.   ? ?The left knee superior lateral suprapatellar pouch was injected using 3 cc of 1% lidocaine on a 22-gauge 1-1/2 inch needle.  The syringe was switched and a mixture containing 1 cc's of 40 mg Kenalog and 4 cc's of 0.25% bupivacaine was injected.  Ultrasound was used. Images were obtained in long views showing the injection.   ? ? ?A sterile dressing was applied. ? ?Patient did tolerate procedure well. ? ? ? ? ?ASSESSMENT & PLAN:  ? ?OA (osteoarthritis) of knee ?Acute on chronic in nature.  Having pain that resembles his previous degenerative type pain.  He was asking about Celebrex but due to his kidney function we will hold off for now.  If his newer labs are reassuring then could consider it. ?-Counseled on home exercise therapy and supportive care. ?-Injection today. ? ? ? ? ?

## 2022-04-02 ENCOUNTER — Ambulatory Visit: Payer: Medicare Other | Admitting: Family Medicine

## 2022-04-03 ENCOUNTER — Ambulatory Visit: Payer: Medicare Other | Admitting: Family Medicine

## 2022-04-08 ENCOUNTER — Ambulatory Visit: Payer: Medicare Other | Admitting: Family Medicine

## 2022-09-01 ENCOUNTER — Encounter: Payer: Self-pay | Admitting: *Deleted

## 2023-01-27 ENCOUNTER — Emergency Department (HOSPITAL_COMMUNITY)
Admission: EM | Admit: 2023-01-27 | Discharge: 2023-01-27 | Disposition: A | Discharge status: Home / Self Care | Payer: Medicare Other

## 2023-01-27 ENCOUNTER — Emergency Department (HOSPITAL_COMMUNITY): Payer: Medicare Other

## 2023-01-27 ENCOUNTER — Other Ambulatory Visit: Payer: Self-pay

## 2023-01-27 DIAGNOSIS — W1830XA Fall on same level, unspecified, initial encounter: Secondary | ICD-10-CM | POA: Insufficient documentation

## 2023-01-27 DIAGNOSIS — S63283A Dislocation of proximal interphalangeal joint of left middle finger, initial encounter: Secondary | ICD-10-CM | POA: Insufficient documentation

## 2023-01-27 DIAGNOSIS — S2241XA Multiple fractures of ribs, right side, initial encounter for closed fracture: Secondary | ICD-10-CM

## 2023-01-27 DIAGNOSIS — S63253A Unspecified dislocation of left middle finger, initial encounter: Secondary | ICD-10-CM

## 2023-01-27 DIAGNOSIS — S6992XA Unspecified injury of left wrist, hand and finger(s), initial encounter: Secondary | ICD-10-CM | POA: Diagnosis present

## 2023-01-27 MED ORDER — LIDOCAINE HCL (PF) 1 % IJ SOLN
5.0000 mL | Freq: Once | INTRAMUSCULAR | Status: DC
  Filled 2023-01-27: qty 30

## 2023-01-27 MED ORDER — ACETAMINOPHEN 325 MG PO TABS
650.0000 mg | ORAL_TABLET | Freq: Once | ORAL | Status: AC
  Administered 2023-01-27: 650 mg via ORAL
  Filled 2023-01-27: qty 2

## 2023-01-27 NOTE — Discharge Instructions (Addendum)
Please refer to the attached instructions. Tylenol for discomfort. Follow-up with PCP and/or orthopedics as needed.
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
## 2023-01-27 NOTE — Progress Notes (Signed)
Orthopedic Tech Progress Note Patient Details:  [employer] Oct 14, 1930 [id number]  Ortho Devices Type of Ortho Device: Finger splint, Ace wrap Ortho Device/Splint Location: left index ffinger splinted Ortho Device/Splint Interventions: Ordered, Application, Adjustment   Post Interventions Patient Tolerated: Well Instructions Provided: Adjustment of device, Care of device  Kizzie Fantasia 01/27/2023, 7:14 PM
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
## 2023-02-25 ENCOUNTER — Telehealth: Payer: Self-pay

## 2023-02-25 NOTE — Telephone Encounter (Signed)
Transition Care Management Unsuccessful Follow-up Telephone Call  Date of discharge and from where:  Nathan Goodwin   Attempts:  1st Attempt  Reason for unsuccessful TCM follow-up call:  No answer/busy   [name]  [facility], [facility] Guide, Phone: [phone] Website: [name]

## 2023-11-11 ENCOUNTER — Telehealth: Payer: Self-pay | Admitting: Neurology

## 2023-11-11 ENCOUNTER — Encounter: Payer: Self-pay | Admitting: Neurology

## 2023-11-11 NOTE — Telephone Encounter (Signed)
 LVM and sent letter in mail informing pt of need to reschedule 02/29/24 appt - MD out

## 2024-02-29 ENCOUNTER — Ambulatory Visit: Admitting: Neurology
